# Patient Record
Sex: Male | Born: 1955 | Race: Black or African American | Hispanic: No | Smoking: Current every day smoker
Health system: Southern US, Community
[De-identification: ages and names within clinical notes are randomized; demographics above are authoritative.]

## PROBLEM LIST (undated history)

## (undated) DIAGNOSIS — F32A Depression, unspecified: Secondary | ICD-10-CM

## (undated) DIAGNOSIS — F1410 Cocaine abuse, uncomplicated: Secondary | ICD-10-CM

## (undated) DIAGNOSIS — I10 Essential (primary) hypertension: Secondary | ICD-10-CM

## (undated) DIAGNOSIS — F329 Major depressive disorder, single episode, unspecified: Secondary | ICD-10-CM

## (undated) DIAGNOSIS — J45909 Unspecified asthma, uncomplicated: Secondary | ICD-10-CM

## (undated) DIAGNOSIS — J449 Chronic obstructive pulmonary disease, unspecified: Secondary | ICD-10-CM

---

## 2017-05-10 ENCOUNTER — Encounter (HOSPITAL_COMMUNITY): Payer: Self-pay | Admitting: Emergency Medicine

## 2017-05-10 ENCOUNTER — Emergency Department (HOSPITAL_COMMUNITY): Payer: Medicare Other

## 2017-05-10 ENCOUNTER — Other Ambulatory Visit: Payer: Self-pay

## 2017-05-10 ENCOUNTER — Observation Stay (HOSPITAL_COMMUNITY)
Admission: EM | Admit: 2017-05-10 | Discharge: 2017-05-11 | Disposition: A | Discharge status: Home / Self Care | Payer: Medicare Other | Attending: Internal Medicine | Admitting: Internal Medicine

## 2017-05-10 DIAGNOSIS — J449 Chronic obstructive pulmonary disease, unspecified: Secondary | ICD-10-CM | POA: Insufficient documentation

## 2017-05-10 DIAGNOSIS — R45851 Suicidal ideations: Secondary | ICD-10-CM

## 2017-05-10 DIAGNOSIS — I10 Essential (primary) hypertension: Secondary | ICD-10-CM | POA: Insufficient documentation

## 2017-05-10 DIAGNOSIS — G621 Alcoholic polyneuropathy: Secondary | ICD-10-CM

## 2017-05-10 DIAGNOSIS — F1910 Other psychoactive substance abuse, uncomplicated: Secondary | ICD-10-CM | POA: Diagnosis not present

## 2017-05-10 DIAGNOSIS — F17210 Nicotine dependence, cigarettes, uncomplicated: Secondary | ICD-10-CM | POA: Insufficient documentation

## 2017-05-10 DIAGNOSIS — F1020 Alcohol dependence, uncomplicated: Secondary | ICD-10-CM | POA: Insufficient documentation

## 2017-05-10 DIAGNOSIS — F1420 Cocaine dependence, uncomplicated: Secondary | ICD-10-CM | POA: Insufficient documentation

## 2017-05-10 DIAGNOSIS — R0602 Shortness of breath: Secondary | ICD-10-CM | POA: Diagnosis not present

## 2017-05-10 DIAGNOSIS — F332 Major depressive disorder, recurrent severe without psychotic features: Secondary | ICD-10-CM | POA: Insufficient documentation

## 2017-05-10 DIAGNOSIS — Z91010 Allergy to peanuts: Secondary | ICD-10-CM | POA: Insufficient documentation

## 2017-05-10 DIAGNOSIS — Z79899 Other long term (current) drug therapy: Secondary | ICD-10-CM | POA: Insufficient documentation

## 2017-05-10 DIAGNOSIS — R079 Chest pain, unspecified: Secondary | ICD-10-CM | POA: Diagnosis present

## 2017-05-10 DIAGNOSIS — R0789 Other chest pain: Principal | ICD-10-CM | POA: Insufficient documentation

## 2017-05-10 DIAGNOSIS — F1320 Sedative, hypnotic or anxiolytic dependence, uncomplicated: Secondary | ICD-10-CM | POA: Insufficient documentation

## 2017-05-10 HISTORY — DX: Essential (primary) hypertension: I10

## 2017-05-10 HISTORY — DX: Major depressive disorder, single episode, unspecified: F32.9

## 2017-05-10 HISTORY — DX: Depression, unspecified: F32.A

## 2017-05-10 LAB — COMPREHENSIVE METABOLIC PANEL
ALT: 29 U/L (ref 17–63)
ANION GAP: 9 (ref 5–15)
AST: 43 U/L — ABNORMAL HIGH (ref 15–41)
Albumin: 3.5 g/dL (ref 3.5–5.0)
Alkaline Phosphatase: 89 U/L (ref 38–126)
BUN: 5 mg/dL — ABNORMAL LOW (ref 6–20)
CHLORIDE: 101 mmol/L (ref 101–111)
CO2: 29 mmol/L (ref 22–32)
Calcium: 9.1 mg/dL (ref 8.9–10.3)
Creatinine, Ser: 0.91 mg/dL (ref 0.61–1.24)
GFR calc Af Amer: >60 mL/min (ref >60)
GFR calc non Af Amer: >60 mL/min (ref >60)
Glucose, Bld: 111 mg/dL — ABNORMAL HIGH (ref 65–99)
Potassium: 3.6 mmol/L (ref 3.5–5.1)
SODIUM: 139 mmol/L (ref 135–145)
Total Bilirubin: 0.9 mg/dL (ref 0.3–1.2)
Total Protein: 7.3 g/dL (ref 6.5–8.1)

## 2017-05-10 LAB — CBC
HCT: 33.3 % — ABNORMAL LOW (ref 39.0–52.0)
HEMOGLOBIN: 11.2 g/dL — AB (ref 13.0–17.0)
MCH: 29.9 pg (ref 26.0–34.0)
MCHC: 33.6 g/dL (ref 30.0–36.0)
MCV: 89 fL (ref 78.0–100.0)
Platelets: 267 10*3/uL (ref 150–400)
RBC: 3.74 MIL/uL — AB (ref 4.22–5.81)
RDW: 15.3 % (ref 11.5–15.5)
WBC: 7 10*3/uL (ref 4.0–10.5)

## 2017-05-10 LAB — RAPID URINE DRUG SCREEN, HOSP PERFORMED
Amphetamines: NOT DETECTED
Barbiturates: POSITIVE — AB
Benzodiazepines: NOT DETECTED
COCAINE: POSITIVE — AB
OPIATES: NOT DETECTED
TETRAHYDROCANNABINOL: POSITIVE — AB

## 2017-05-10 LAB — SALICYLATE LEVEL: Salicylate Lvl: <7 mg/dL (ref 2.8–30.0)

## 2017-05-10 LAB — I-STAT TROPONIN, ED
Troponin i, poc: 0.01 ng/mL (ref 0.00–0.08)
Troponin i, poc: 0 ng/mL (ref 0.00–0.08)

## 2017-05-10 LAB — ETHANOL: Alcohol, Ethyl (B): <10 mg/dL (ref <10)

## 2017-05-10 LAB — ACETAMINOPHEN LEVEL

## 2017-05-10 MED ORDER — AMLODIPINE BESYLATE 5 MG PO TABS
5.0000 mg | ORAL_TABLET | Freq: Once | ORAL | Status: AC
  Administered 2017-05-10: 5 mg via ORAL
  Filled 2017-05-10: qty 1

## 2017-05-10 MED ORDER — NAPROXEN 250 MG PO TABS
500.0000 mg | ORAL_TABLET | Freq: Two times a day (BID) | ORAL | Status: AC | PRN
  Administered 2017-05-10 – 2017-05-11 (×2): 500 mg via ORAL
  Filled 2017-05-10 (×2): qty 2

## 2017-05-10 MED ORDER — ENOXAPARIN SODIUM 40 MG/0.4ML ~~LOC~~ SOLN
40.0000 mg | SUBCUTANEOUS | Status: DC
  Administered 2017-05-10: 40 mg via SUBCUTANEOUS
  Filled 2017-05-10: qty 0.4

## 2017-05-10 MED ORDER — ADULT MULTIVITAMIN W/MINERALS CH
1.0000 | ORAL_TABLET | Freq: Every day | ORAL | Status: DC
  Administered 2017-05-10 – 2017-05-11 (×2): 1 via ORAL
  Filled 2017-05-10 (×2): qty 1

## 2017-05-10 MED ORDER — IPRATROPIUM-ALBUTEROL 0.5-2.5 (3) MG/3ML IN SOLN
3.0000 mL | Freq: Once | RESPIRATORY_TRACT | Status: AC
Start: 2017-05-10 — End: 2017-05-10
  Administered 2017-05-10: 3 mL via RESPIRATORY_TRACT
  Filled 2017-05-10: qty 3

## 2017-05-10 MED ORDER — VITAMIN B-1 100 MG PO TABS
100.0000 mg | ORAL_TABLET | Freq: Every day | ORAL | Status: DC
  Administered 2017-05-10 – 2017-05-11 (×2): 100 mg via ORAL
  Filled 2017-05-10 (×2): qty 1

## 2017-05-10 MED ORDER — FOLIC ACID 1 MG PO TABS
1.0000 mg | ORAL_TABLET | Freq: Every day | ORAL | Status: DC
  Administered 2017-05-10 – 2017-05-11 (×2): 1 mg via ORAL
  Filled 2017-05-10 (×2): qty 1

## 2017-05-10 MED ORDER — ATORVASTATIN CALCIUM 20 MG PO TABS
20.0000 mg | ORAL_TABLET | Freq: Every day | ORAL | Status: DC
  Administered 2017-05-10 – 2017-05-11 (×2): 20 mg via ORAL
  Filled 2017-05-10 (×3): qty 1

## 2017-05-10 MED ORDER — ACETAMINOPHEN 650 MG RE SUPP: 650.0000 mg | Freq: Four times a day (QID) | RECTAL | Status: DC | PRN

## 2017-05-10 MED ORDER — CITALOPRAM HYDROBROMIDE 20 MG PO TABS
20.0000 mg | ORAL_TABLET | Freq: Every day | ORAL | Status: DC
  Administered 2017-05-10 – 2017-05-11 (×2): 20 mg via ORAL
  Filled 2017-05-10 (×2): qty 1

## 2017-05-10 MED ORDER — SENNOSIDES-DOCUSATE SODIUM 8.6-50 MG PO TABS: 1.0000 | ORAL_TABLET | Freq: Every evening | ORAL | Status: DC | PRN

## 2017-05-10 MED ORDER — NICOTINE 21 MG/24HR TD PT24
21.0000 mg | MEDICATED_PATCH | Freq: Every day | TRANSDERMAL | Status: DC
  Administered 2017-05-10 – 2017-05-11 (×2): 21 mg via TRANSDERMAL
  Filled 2017-05-10 (×2): qty 1

## 2017-05-10 MED ORDER — THIAMINE HCL 100 MG/ML IJ SOLN: 100.0000 mg | Freq: Every day | INTRAMUSCULAR | Status: DC

## 2017-05-10 MED ORDER — ALBUTEROL SULFATE (2.5 MG/3ML) 0.083% IN NEBU
2.5000 mg | INHALATION_SOLUTION | Freq: Four times a day (QID) | RESPIRATORY_TRACT | Status: DC | PRN
  Filled 2017-05-10: qty 3

## 2017-05-10 MED ORDER — METOPROLOL TARTRATE 25 MG PO TABS
25.0000 mg | ORAL_TABLET | Freq: Two times a day (BID) | ORAL | Status: DC
  Administered 2017-05-10 – 2017-05-11 (×2): 25 mg via ORAL
  Filled 2017-05-10 (×2): qty 1

## 2017-05-10 MED ORDER — ACETAMINOPHEN 325 MG PO TABS
650.0000 mg | ORAL_TABLET | Freq: Four times a day (QID) | ORAL | Status: DC | PRN
  Administered 2017-05-10 – 2017-05-11 (×2): 650 mg via ORAL
  Filled 2017-05-10 (×2): qty 2

## 2017-05-10 MED ORDER — ENSURE ENLIVE PO LIQD: 237.0000 mL | Freq: Two times a day (BID) | ORAL | Status: DC

## 2017-05-10 NOTE — ED Triage Notes (Signed)
EMS stated, onset acute chest pain no other symptoms

## 2017-05-10 NOTE — ED Provider Notes (Signed)
[facility] EMERGENCY DEPARTMENT Provider Note   CSN: [id number] Arrival date & time: 05/10/17  1032     History   Chief Complaint Chief Complaint  Patient presents with  . Chest Pain  . Suicidal  . Addiction Problem    HPI Jonathan Martin is a 62 y.o. male with history of depression, hypertension, COPD, hyperlipidemia presents today for evaluation of acute onset, personally improving substernal chest pain and suicidal ideation.  He states that pain began at around 9 AM this morning when he was getting up from a seated position.  Pain was constant pressure and has "eased up now".  He noted mild shortness of breath associated with this initially which has resolved and which eh attributed to his COPD.  Denies diaphoresis, lightheadedness, syncope, nausea, or vomiting.  He is a current smoker. States he thinks he has had an MI in the past.   He is also complaining of progressively worsening suicidal ideations.  He states that he has a plan to kill himself and bought a knife yesterday.  He states "I want to put it up to my jugular and bleed out ".  He denies homicidal ideation or auditory or visual hallucinations.  He does admit to substance abuse and uses cocaine and marijuana. He tells me "I used as often as I can get my hands on it".   He tells me he is originally from [facility] [facility] and has been "bouncing around all over the place ".  He states he has been staying at hotels.  He tells me that he has lost all of his home medications but is unsure how.  The history is provided by the patient.    Past Medical History:  Diagnosis Date  . Depression   . Hypertension     Patient Active Problem List   Diagnosis Date Noted  . Major depressive disorder, recurrent episode, severe (HCC) 05/11/2017  . Polysubstance abuse (HCC) 05/11/2017  . MDD (major depressive disorder), recurrent episode, severe (HCC) 05/11/2017  . Suicidal ideations   . Alcoholic polyneuropathy  (HCC)   . Chest pain 05/10/2017    History reviewed. No pertinent surgical history.      Home Medications    Prior to Admission medications   Medication Sig Start Date End Date Taking? Authorizing Provider  amLODipine (NORVASC) 10 MG tablet Take 10 mg by mouth daily.   Yes [provider]  atorvastatin (LIPITOR) 20 MG tablet Take 20 mg by mouth daily at 6 PM.   Yes [provider]  citalopram (CELEXA) 40 MG tablet Take 40 mg by mouth daily.   Yes [provider]  ENSURE (ENSURE) Take 237 mLs by mouth 2 (two) times daily between meals.   Yes [provider]  famotidine (PEPCID) 20 MG tablet Take 20 mg by mouth 2 (two) times daily.   Yes [provider]  metoprolol tartrate (LOPRESSOR) 50 MG tablet Take 50 mg by mouth 2 (two) times daily.   Yes [provider]  Multiple Vitamin (MULTIVITAMIN) capsule Take 1 capsule by mouth daily.   Yes [provider]  naproxen (NAPROSYN) 500 MG tablet Take 500 mg by mouth as needed.    Yes [provider]  nicotine (NICODERM CQ - DOSED IN MG/24 HOURS) 21 mg/24hr patch Place 21 mg onto the skin daily.   Yes [provider]  pyridOXINE (VITAMIN B-6) 100 MG tablet Take 100 mg by mouth daily.   Yes [provider]  tamsulosin (FLOMAX) 0.4 MG CAPS capsule Take 0.4 mg by mouth.   Yes [provider]  Albuterol Sulfate 108 (90 Base) MCG/ACT AEPB Inhale 2 puffs into the lungs as needed.    [provider]  hydrocortisone cream 1 % Apply topically 2 (two) times daily as needed for itching. 05/11/17   Levora Dredge, MD  thiamine 100 MG tablet Take 1 tablet (100 mg total) by mouth daily. 05/12/17 06/11/17  Levora Dredge, MD    Family History No family history on file.  Social History Social History   Tobacco Use  . Smoking status: Current Every Day Smoker    Packs/day: 1.00  . Smokeless tobacco: Current User  Substance Use Topics  . Alcohol use: Yes      Alcohol/week: 10.8 oz    Types: 12 Cans of beer, 6 Glasses of wine per week    Comment: drinks daily  . Drug use: Yes    Frequency: 7.0 times per week    Types: Cocaine, Marijuana    Comment: uses daily on binges, whole paycheck     Allergies   Lisinopril and Peanut-containing drug products   Review of Systems Review of Systems  Constitutional: Negative for chills and fever.  Respiratory: Positive for shortness of breath. Negative for cough.   Cardiovascular: Positive for chest pain. Negative for palpitations and leg swelling.  Gastrointestinal: Negative for abdominal pain, nausea and vomiting.  Psychiatric/Behavioral: Positive for suicidal ideas.  All other systems reviewed and are negative.    Physical Exam Updated Vital Signs BP (!) 128/95 (BP Location: Right Arm)   Pulse 76   Temp 98.2 F (36.8 C) (Oral)   Resp 18   Ht  (1.854 m)   Wt 86.7 kg (191 lb 3.2 oz)   SpO2 100%   BMI 25.23 kg/m   Physical Exam  Constitutional: He appears well-developed and well-nourished. No distress.  HENT:  Head: Normocephalic and atraumatic.  Eyes: Conjunctivae are normal. Right eye exhibits no discharge. Left eye exhibits no discharge.  Neck: Normal range of motion. Neck supple. No JVD present. No tracheal deviation present.  Cardiovascular: Normal rate and regular rhythm.  Pulses:      Carotid pulses are 2+ on the right side, and 2+ on the left side.      Radial pulses are 2+ on the right side, and 2+ on the left side.       Dorsalis pedis pulses are 2+ on the right side, and 2+ on the left side.       Posterior tibial pulses are 2+ on the right side, and 2+ on the left side.  Pulmonary/Chest: Effort normal. He has wheezes.  Mild scattered expiratory wheezes.  Speaking in full sentences without difficulty.  SPO2 saturations 100% on room air.  Abdominal: Soft. Bowel sounds are normal. He exhibits no distension. There is no tenderness.  Musculoskeletal: Normal range of  motion. He exhibits no edema.       Right lower leg: Normal. He exhibits no tenderness and no edema.       Left lower leg: Normal. He exhibits no tenderness and no edema.  Neurological: He is alert.  Skin: Skin is warm and dry. No erythema.  Psychiatric: His speech is normal. He expresses suicidal ideation. He expresses no homicidal ideation. He expresses suicidal plans. He expresses no homicidal plans.  Does not appear to be responding to internal stimuli at this time.  Somewhat agitated.  Nursing note and vitals reviewed.  ED Treatments / Results  Labs (all labs ordered are listed, but only abnormal results are displayed) Labs Reviewed  COMPREHENSIVE METABOLIC PANEL - Abnormal; Notable for the following components:      Result Value   Glucose, Bld 111 (*)    BUN 5 (*)    AST 43 (*)    All other components within normal limits  ACETAMINOPHEN LEVEL - Abnormal; Notable for the following components:   Acetaminophen (Tylenol), Serum <10 (*)    All other components within normal limits  CBC - Abnormal; Notable for the following components:   RBC 3.74 (*)    Hemoglobin 11.2 (*)    HCT 33.3 (*)    All other components within normal limits  RAPID URINE DRUG SCREEN, HOSP PERFORMED - Abnormal; Notable for the following components:   Cocaine POSITIVE (*)    Tetrahydrocannabinol POSITIVE (*)    Barbiturates POSITIVE (*)    All other components within normal limits  BASIC METABOLIC PANEL - Abnormal; Notable for the following components:   Potassium 3.3 (*)    Chloride 100 (*)    BUN 5 (*)    Calcium 8.7 (*)    All other components within normal limits  CBC - Abnormal; Notable for the following components:   RBC 3.81 (*)    Hemoglobin 11.0 (*)    HCT 34.1 (*)    All other components within normal limits  ETHANOL  SALICYLATE LEVEL  HIV ANTIBODY (ROUTINE TESTING)  TSH  VITAMIN B12  HEMOGLOBIN A1C  I-STAT TROPONIN, ED  I-STAT TROPONIN, ED    EKG EKG  Interpretation  Date/Time:  Sunday May 10 2017 10:51:14 EDT Ventricular Rate:  76 PR Interval:  154 QRS Duration: 90 QT Interval:  414 QTC Calculation: 465 R Axis:   73 Text Interpretation:  Normal sinus rhythm Nonspecific T wave abnormality Prolonged QT Abnormal ECG no prior to compare with Confirmed by Meridee Score [phone]) on 05/10/2017 2:04:57 PM   Radiology Dg Chest 2 View  Result Date: 05/10/2017 CLINICAL DATA:  Chest pain EXAM: CHEST - 2 VIEW COMPARISON:  None. FINDINGS: Normal heart size. Normal mediastinal contour. No pneumothorax. No pleural effusion. Mildly hyperinflated lungs. No pulmonary edema. No acute consolidative airspace disease. IMPRESSION: Mildly hyperinflated lungs, suggesting COPD. Otherwise no active cardiopulmonary disease. Electronically Signed   By: Delbert Phenix M.D.   On: 05/10/2017 13:55    Procedures Procedures (including critical care time)  Medications Ordered in ED Medications  ipratropium-albuterol (DUONEB) 0.5-2.5 (3) MG/3ML nebulizer solution 3 mL (3 mLs Nebulization Given 05/10/17 1448)  amLODipine (NORVASC) tablet 5 mg (5 mg Oral Given 05/10/17 1835)  naproxen (NAPROSYN) tablet 500 mg (500 mg Oral Given 05/11/17 0804)  potassium chloride SA (K-DUR,KLOR-CON) CR tablet 40 mEq (40 mEq Oral Given 05/11/17 1352)     Initial Impression / Assessment and Plan / ED Course  I have reviewed the triage vital signs and the nursing notes.  Pertinent labs & imaging results that were available during my care of the patient were reviewed by me and considered in my medical decision making (see chart for details).     Patient presents for evaluation of chest pains as well as progressively worsening suicidal ideations.  He is afebrile, vital signs are stable in the ED.  He is nontoxic in appearance.  EKG shows nonspecific T wave abnormalities but we have no comparison to note if this is acute or chronic.  Initial troponin is negative.  Chest x-ray shows changes  consistent  with COPD.  Remainder of lab work shows mild hypokalemia and UDS positive for cocaine, barbiturates, and THC.  He tells me that he uses cocaine as often as he possibly can.  With a HEART score of 5, and given he is homeless with unreliable follow-up, he would benefit from chest pain rule out.  He will require psychiatric evaluation as well.  Spoke with internal medicine teaching service who agreed to assume care of patient and bring him into the hospital for further evaluation and management.  Final Clinical Impressions(s) / ED Diagnoses   Final diagnoses:  Atypical chest pain  SOB (shortness of breath)  Polysubstance abuse (HCC)  Suicidal ideations    ED Discharge Orders        Ordered    thiamine 100 MG tablet  Daily     05/11/17 1428    hydrocortisone cream 1 %  2 times daily PRN     05/11/17 1428       Adeeb Konecny, Putnam A, PA-C 05/12/17 0949    Terrilee Files, MD 05/12/17 [phone]

## 2017-05-10 NOTE — H&P (Signed)
Date: 05/10/2017               Patient Name:  Jonathan Martin MRN: [id number]  DOB: 1955-05-28 Age / Sex: 62 y.o., male   PCP: Patient, No Pcp Per         Medical Service: Internal Medicine Teaching Service         Attending Physician: Dr. Doneen Poisson, MD    First Contact: Dr. Caron Presume Pager: [phone]  Second Contact: Dr. Antony Contras Pager: [phone]       After Hours (After 5p/  First Contact Pager: [phone]  weekends / holidays): Second Contact Pager: [phone]   Chief Complaint: Chest pain   History of Present Illness: Patient is a 62 y.o male with depression, HTN, COPD, substance use disorder (EtOH, cocaine, and marijuana) and HLD presented with acute onset chest pain. He states that the pain began around 9 AM and was a constant pressure. Associated symptoms include SOB. He denies N/V, diaphoresis, lightheadedness, or dizziness with the episode. The patient voices that he has chest pain every time he uses a lot of EtOH, cocaine, and marijuana. He in now currently chest pain free.     He is also endorsing SI, HI, and a suicide plan. He states that since his wife left him 2 years ago he has been spending all his money on drugs and alcohol. During these benders he becomes even more depressed and thinks about hurting himself. Yesterday after a bender he bought a knife and wanted to cut his throat. He is current homeless and bounces from town to town.   The patient has never withdrawal and never goes more than 1 day without drugs or alcohol. He denies seizures or hallucinations (auditory or visual).  Meds:  Current Meds  Medication Sig  . amLODipine (NORVASC) 10 MG tablet Take 10 mg by mouth daily.  Marland Kitchen atorvastatin (LIPITOR) 20 MG tablet Take 20 mg by mouth daily at 6 PM.  . citalopram (CELEXA) 40 MG tablet Take 40 mg by mouth daily.  Marland Kitchen ENSURE (ENSURE) Take 237 mLs by mouth 2 (two) times daily between meals.  . famotidine (PEPCID) 20 MG tablet Take 20 mg by mouth 2 (two) times daily.  .  metoprolol tartrate (LOPRESSOR) 50 MG tablet Take 50 mg by mouth 2 (two) times daily.  . Multiple Vitamin (MULTIVITAMIN) capsule Take 1 capsule by mouth daily.  . naproxen (NAPROSYN) 500 MG tablet Take 500 mg by mouth as needed.   . nicotine (NICODERM CQ - DOSED IN MG/24 HOURS) 21 mg/24hr patch Place 21 mg onto the skin daily.  Marland Kitchen pyridOXINE (VITAMIN B-6) 100 MG tablet Take 100 mg by mouth daily.  . tamsulosin (FLOMAX) 0.4 MG CAPS capsule Take 0.4 mg by mouth.   Allergies: Allergies as of 05/10/2017 - Review Complete 05/10/2017  Allergen Reaction Noted  . Lisinopril Swelling 05/10/2017  . Peanut-containing drug products Swelling 05/10/2017   Past Medical History:  Diagnosis Date  . Depression   . Hypertension    Family History:  Denies family history of CAD, CVA, PAD.   Social History:  Homeless  Uses EtOH, cocaine, and barbiturates   Review of Systems: A complete ROS was negative except as per HPI.  Physical Exam: Blood pressure (!) 148/92, pulse 69, temperature 99.2 F (37.3 C), temperature source Oral, resp. rate 19, height  (1.854 m), weight 192 lb 6.4 oz (87.3 kg), SpO2 100 %.  General: Well nourished male in no acute distress HENT: Normocephalic, atraumatic,  moist mucus membranes Pulm: Good air movement with no wheezing or crackles  CV: RRR, no murmurs, no rubs  Abdomen: Active bowel sounds, soft, non-distended, no tenderness to palpation  Extremities: Pulses palpable in all extremities, no LE edema  Skin: Warm and dry  Neuro: Alert and oriented x 3  EKG: personally reviewed: my interpretation is sinus rhythm with normal axis. Normal intervals. No pathological Q waves appreciated. No ST or T wave abnormalities.   CXR: personally reviewed: my interpretation is good penetration and rotation. No bony or soft tissue abnormalities. No infiltrates or opacities. No cardiomegaly. No increased vascular congestion.   Assessment & Plan by Problem: Active Problems:    Chest pain  Drug-induce chest pain. Patient is a 62 y.o male with depression, HTN, COPD, substance use disorder (EtOH, cocaine, and marijuana) and HLD presented with acute onset chest pain. He endorses recurrent symptoms every time he uses a bunch of EtOH, cocaine, and barbiturates. EKG was nonischemic, and negative troponin x 2. At this point the most likely etiology of the patient's chest pain is substance abuse.  - No further work-up indicated, medically stable   Depression with SI. Patient has a history of depression treated with citalopram 20 mg QD but has been off it for 4-5 days. He feels that it does work well for him and his SI stems mainly from his substance abuse.  - Consult psychiatry in the AM  - Continue citalopram 20 mg QD   Polysubstance abuse. Patient endorses the use of EtOH, cocaine, and barbiturates. Also smokes tobacco.  - Telemetry  - CIWA without ativan  - Nicotine patch   HTN.  - Continue home amlodipine and metoprolol   Asthma.  - Continue home albuterol   Diet: Regular  VTE ppx: Lovenox  CODE STATUS: Full code  Dispo: Admit patient to Inpatient with expected length of stay greater than 2 midnights.  Signed: Levora Dredge, MD 05/10/2017, 6:13 PM  My Pager: [phone]

## 2017-05-10 NOTE — ED Notes (Signed)
Paged security and house coverage for a sitter.
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
## 2017-05-10 NOTE — ED Notes (Signed)
Regular dinner tray ordered 

## 2017-05-10 NOTE — ED Triage Notes (Signed)
22  g left AC

## 2017-05-10 NOTE — ED Triage Notes (Signed)
Pt. Stated, I  Having chest pain and my feet are burning. Im addicted to Cocaine and need some help. I got a knife yesterday to kill myself but did not. Im depressed and I need to stop using drugs.

## 2017-05-10 NOTE — Plan of Care (Signed)
  Problem: Coping: Goal: Level of anxiety will decrease Outcome: Not Progressing   Problem: Health Behavior/Discharge Planning: Goal: Ability to manage health-related needs will improve Outcome: Progressing

## 2017-05-11 ENCOUNTER — Other Ambulatory Visit: Payer: Self-pay

## 2017-05-11 ENCOUNTER — Inpatient Hospital Stay (HOSPITAL_COMMUNITY)
Admission: AD | Admit: 2017-05-11 | Discharge: 2017-05-14 | DRG: 885 | Disposition: A | Discharge status: Home / Self Care | Payer: Medicare Other | Source: Intra-hospital | Attending: Psychiatry | Admitting: Psychiatry

## 2017-05-11 ENCOUNTER — Encounter (HOSPITAL_COMMUNITY): Payer: Self-pay | Admitting: *Deleted

## 2017-05-11 DIAGNOSIS — Z888 Allergy status to other drugs, medicaments and biological substances status: Secondary | ICD-10-CM

## 2017-05-11 DIAGNOSIS — R079 Chest pain, unspecified: Secondary | ICD-10-CM | POA: Diagnosis not present

## 2017-05-11 DIAGNOSIS — R45851 Suicidal ideations: Secondary | ICD-10-CM | POA: Diagnosis present

## 2017-05-11 DIAGNOSIS — G629 Polyneuropathy, unspecified: Secondary | ICD-10-CM

## 2017-05-11 DIAGNOSIS — K219 Gastro-esophageal reflux disease without esophagitis: Secondary | ICD-10-CM | POA: Diagnosis present

## 2017-05-11 DIAGNOSIS — F419 Anxiety disorder, unspecified: Secondary | ICD-10-CM | POA: Diagnosis present

## 2017-05-11 DIAGNOSIS — E785 Hyperlipidemia, unspecified: Secondary | ICD-10-CM | POA: Diagnosis not present

## 2017-05-11 DIAGNOSIS — Z720 Tobacco use: Secondary | ICD-10-CM | POA: Diagnosis not present

## 2017-05-11 DIAGNOSIS — F1099 Alcohol use, unspecified with unspecified alcohol-induced disorder: Secondary | ICD-10-CM | POA: Diagnosis not present

## 2017-05-11 DIAGNOSIS — F1910 Other psychoactive substance abuse, uncomplicated: Secondary | ICD-10-CM | POA: Diagnosis present

## 2017-05-11 DIAGNOSIS — Z91010 Allergy to peanuts: Secondary | ICD-10-CM

## 2017-05-11 DIAGNOSIS — F332 Major depressive disorder, recurrent severe without psychotic features: Secondary | ICD-10-CM | POA: Diagnosis present

## 2017-05-11 DIAGNOSIS — I10 Essential (primary) hypertension: Secondary | ICD-10-CM

## 2017-05-11 DIAGNOSIS — F1210 Cannabis abuse, uncomplicated: Secondary | ICD-10-CM

## 2017-05-11 DIAGNOSIS — F1010 Alcohol abuse, uncomplicated: Secondary | ICD-10-CM | POA: Diagnosis not present

## 2017-05-11 DIAGNOSIS — R0602 Shortness of breath: Secondary | ICD-10-CM | POA: Diagnosis not present

## 2017-05-11 DIAGNOSIS — R0789 Other chest pain: Secondary | ICD-10-CM | POA: Diagnosis not present

## 2017-05-11 DIAGNOSIS — F1410 Cocaine abuse, uncomplicated: Secondary | ICD-10-CM | POA: Diagnosis present

## 2017-05-11 DIAGNOSIS — Z590 Homelessness: Secondary | ICD-10-CM

## 2017-05-11 DIAGNOSIS — G621 Alcoholic polyneuropathy: Secondary | ICD-10-CM | POA: Diagnosis present

## 2017-05-11 DIAGNOSIS — N400 Enlarged prostate without lower urinary tract symptoms: Secondary | ICD-10-CM | POA: Diagnosis present

## 2017-05-11 DIAGNOSIS — F17210 Nicotine dependence, cigarettes, uncomplicated: Secondary | ICD-10-CM

## 2017-05-11 DIAGNOSIS — J45909 Unspecified asthma, uncomplicated: Secondary | ICD-10-CM | POA: Diagnosis present

## 2017-05-11 DIAGNOSIS — J449 Chronic obstructive pulmonary disease, unspecified: Secondary | ICD-10-CM

## 2017-05-11 DIAGNOSIS — F1020 Alcohol dependence, uncomplicated: Secondary | ICD-10-CM | POA: Diagnosis present

## 2017-05-11 DIAGNOSIS — G4700 Insomnia, unspecified: Secondary | ICD-10-CM | POA: Diagnosis present

## 2017-05-11 DIAGNOSIS — R454 Irritability and anger: Secondary | ICD-10-CM

## 2017-05-11 DIAGNOSIS — Z79899 Other long term (current) drug therapy: Secondary | ICD-10-CM

## 2017-05-11 LAB — CBC
HEMATOCRIT: 34.1 % — AB (ref 39.0–52.0)
HEMOGLOBIN: 11 g/dL — AB (ref 13.0–17.0)
MCH: 28.9 pg (ref 26.0–34.0)
MCHC: 32.3 g/dL (ref 30.0–36.0)
MCV: 89.5 fL (ref 78.0–100.0)
Platelets: 244 10*3/uL (ref 150–400)
RBC: 3.81 MIL/uL — AB (ref 4.22–5.81)
RDW: 15.5 % (ref 11.5–15.5)
WBC: 7.7 10*3/uL (ref 4.0–10.5)

## 2017-05-11 LAB — BASIC METABOLIC PANEL
Anion gap: 10 (ref 5–15)
BUN: 5 mg/dL — AB (ref 6–20)
CHLORIDE: 100 mmol/L — AB (ref 101–111)
CO2: 27 mmol/L (ref 22–32)
Calcium: 8.7 mg/dL — ABNORMAL LOW (ref 8.9–10.3)
Creatinine, Ser: 0.95 mg/dL (ref 0.61–1.24)
GFR calc Af Amer: >60 mL/min (ref >60)
GFR calc non Af Amer: >60 mL/min (ref >60)
Glucose, Bld: 88 mg/dL (ref 65–99)
POTASSIUM: 3.3 mmol/L — AB (ref 3.5–5.1)
SODIUM: 137 mmol/L (ref 135–145)

## 2017-05-11 LAB — HIV ANTIBODY (ROUTINE TESTING W REFLEX): HIV Screen 4th Generation wRfx: NONREACTIVE

## 2017-05-11 LAB — HEMOGLOBIN A1C
Hgb A1c MFr Bld: 5.4 % (ref 4.8–5.6)
Mean Plasma Glucose: 108.28 mg/dL

## 2017-05-11 LAB — VITAMIN B12: Vitamin B-12: 655 pg/mL (ref 180–914)

## 2017-05-11 LAB — TSH: TSH: 0.448 u[IU]/mL (ref 0.350–4.500)

## 2017-05-11 MED ORDER — HYDROCORTISONE 1 % EX CREA: TOPICAL_CREAM | Freq: Two times a day (BID) | CUTANEOUS | 0 refills | Status: DC | PRN

## 2017-05-11 MED ORDER — VITAMIN B-6 100 MG PO TABS
100.0000 mg | ORAL_TABLET | Freq: Every day | ORAL | Status: DC
  Administered 2017-05-12 – 2017-05-14 (×3): 100 mg via ORAL
  Filled 2017-05-11 (×5): qty 1

## 2017-05-11 MED ORDER — POTASSIUM CHLORIDE CRYS ER 20 MEQ PO TBCR
40.0000 meq | EXTENDED_RELEASE_TABLET | Freq: Once | ORAL | Status: AC
  Administered 2017-05-11: 40 meq via ORAL
  Filled 2017-05-11: qty 2

## 2017-05-11 MED ORDER — POLYETHYLENE GLYCOL 3350 17 G PO PACK
17.0000 g | PACK | Freq: Every day | ORAL | Status: DC
  Administered 2017-05-11: 17 g via ORAL
  Filled 2017-05-11: qty 1

## 2017-05-11 MED ORDER — HYDROXYZINE HCL 25 MG PO TABS: 25.0000 mg | ORAL_TABLET | Freq: Four times a day (QID) | ORAL | Status: DC | PRN

## 2017-05-11 MED ORDER — MAGNESIUM HYDROXIDE 400 MG/5ML PO SUSP
30.0000 mL | Freq: Every day | ORAL | Status: DC | PRN
  Administered 2017-05-12: 30 mL via ORAL
  Filled 2017-05-11: qty 30

## 2017-05-11 MED ORDER — ALUM & MAG HYDROXIDE-SIMETH 200-200-20 MG/5ML PO SUSP: 30.0000 mL | ORAL | Status: DC | PRN

## 2017-05-11 MED ORDER — TAMSULOSIN HCL 0.4 MG PO CAPS
0.4000 mg | ORAL_CAPSULE | Freq: Every day | ORAL | Status: DC
  Administered 2017-05-11: 0.4 mg via ORAL
  Filled 2017-05-11: qty 1

## 2017-05-11 MED ORDER — THIAMINE HCL 100 MG PO TABS: 100.0000 mg | ORAL_TABLET | Freq: Every day | ORAL | 0 refills | Status: DC

## 2017-05-11 MED ORDER — CITALOPRAM HYDROBROMIDE 40 MG PO TABS
40.0000 mg | ORAL_TABLET | Freq: Every day | ORAL | Status: DC
  Administered 2017-05-12 – 2017-05-14 (×3): 40 mg via ORAL
  Filled 2017-05-11: qty 1
  Filled 2017-05-11: qty 2
  Filled 2017-05-11 (×4): qty 1

## 2017-05-11 MED ORDER — VITAMIN B-1 100 MG PO TABS
100.0000 mg | ORAL_TABLET | Freq: Every day | ORAL | Status: DC
  Administered 2017-05-12 – 2017-05-14 (×3): 100 mg via ORAL
  Filled 2017-05-11 (×6): qty 1

## 2017-05-11 MED ORDER — ATORVASTATIN CALCIUM 20 MG PO TABS
20.0000 mg | ORAL_TABLET | Freq: Every day | ORAL | Status: DC
  Administered 2017-05-12 – 2017-05-13 (×2): 20 mg via ORAL
  Filled 2017-05-11 (×4): qty 1

## 2017-05-11 MED ORDER — NAPROXEN 250 MG PO TABS
500.0000 mg | ORAL_TABLET | Freq: Two times a day (BID) | ORAL | Status: DC
  Administered 2017-05-11: 500 mg via ORAL
  Filled 2017-05-11: qty 2

## 2017-05-11 MED ORDER — NICOTINE 21 MG/24HR TD PT24
21.0000 mg | MEDICATED_PATCH | Freq: Every day | TRANSDERMAL | Status: DC
  Administered 2017-05-12 – 2017-05-14 (×3): 21 mg via TRANSDERMAL
  Filled 2017-05-11 (×6): qty 1

## 2017-05-11 MED ORDER — ALBUTEROL SULFATE HFA 108 (90 BASE) MCG/ACT IN AERS: 2.0000 | INHALATION_SPRAY | RESPIRATORY_TRACT | Status: DC | PRN

## 2017-05-11 MED ORDER — NAPROXEN 500 MG PO TABS
500.0000 mg | ORAL_TABLET | Freq: Two times a day (BID) | ORAL | Status: DC
  Filled 2017-05-11 (×2): qty 1

## 2017-05-11 MED ORDER — METOPROLOL TARTRATE 50 MG PO TABS
50.0000 mg | ORAL_TABLET | Freq: Two times a day (BID) | ORAL | Status: DC
  Administered 2017-05-11 – 2017-05-14 (×6): 50 mg via ORAL
  Filled 2017-05-11 (×6): qty 1
  Filled 2017-05-11: qty 2
  Filled 2017-05-11 (×2): qty 1
  Filled 2017-05-11: qty 2
  Filled 2017-05-11 (×2): qty 1

## 2017-05-11 MED ORDER — ACETAMINOPHEN 325 MG PO TABS
650.0000 mg | ORAL_TABLET | Freq: Four times a day (QID) | ORAL | Status: DC | PRN
  Administered 2017-05-11: 650 mg via ORAL
  Filled 2017-05-11 (×2): qty 2

## 2017-05-11 MED ORDER — TRAZODONE HCL 50 MG PO TABS
50.0000 mg | ORAL_TABLET | Freq: Every evening | ORAL | Status: DC | PRN
Start: 2017-05-11 — End: 2017-05-14
  Filled 2017-05-11 (×10): qty 1

## 2017-05-11 MED ORDER — ENSURE ENLIVE PO LIQD
237.0000 mL | Freq: Two times a day (BID) | ORAL | Status: DC
  Administered 2017-05-12 – 2017-05-14 (×5): 237 mL via ORAL

## 2017-05-11 MED ORDER — NAPHAZOLINE-GLYCERIN 0.012-0.2 % OP SOLN
1.0000 [drp] | Freq: Four times a day (QID) | OPHTHALMIC | Status: DC | PRN
  Filled 2017-05-11: qty 15

## 2017-05-11 MED ORDER — AMLODIPINE BESYLATE 5 MG PO TABS
5.0000 mg | ORAL_TABLET | Freq: Every day | ORAL | Status: DC
  Administered 2017-05-11: 5 mg via ORAL
  Filled 2017-05-11: qty 1

## 2017-05-11 MED ORDER — AMLODIPINE BESYLATE 10 MG PO TABS
10.0000 mg | ORAL_TABLET | Freq: Every day | ORAL | Status: DC
  Administered 2017-05-12 – 2017-05-14 (×3): 10 mg via ORAL
  Filled 2017-05-11 (×5): qty 1
  Filled 2017-05-11: qty 2

## 2017-05-11 MED ORDER — HYDROCORTISONE 1 % EX CREA
TOPICAL_CREAM | Freq: Two times a day (BID) | CUTANEOUS | Status: DC | PRN
  Filled 2017-05-11: qty 28

## 2017-05-11 MED ORDER — NAPROXEN 500 MG PO TABS
500.0000 mg | ORAL_TABLET | Freq: Two times a day (BID) | ORAL | Status: DC
  Administered 2017-05-12 – 2017-05-14 (×5): 500 mg via ORAL
  Filled 2017-05-11 (×11): qty 1

## 2017-05-11 MED ORDER — TAMSULOSIN HCL 0.4 MG PO CAPS
0.4000 mg | ORAL_CAPSULE | Freq: Every day | ORAL | Status: DC
  Administered 2017-05-12 – 2017-05-13 (×2): 0.4 mg via ORAL
  Filled 2017-05-11 (×4): qty 1

## 2017-05-11 NOTE — Clinical Social Work Note (Signed)
Clinical Social Work Assessment  Patient Details  Name: Jonathan Martin MRN: [id number] Date of Birth: [birth year]  Date of referral:  05/11/17               Reason for consult:  Facility Placement, Suicide Risk/Attempt, Mental Health Concerns, Substance Use/ETOH Abuse                Permission sought to share information with:  Facility Art therapist granted to share information::     Name::        Agency::  [facility]  Relationship::     Contact Information:     Housing/Transportation Living arrangements for the past 2 months:  No permanent address Source of Information:  Patient Patient Interpreter Needed:  None Criminal Activity/Legal Involvement Pertinent to Current Situation/Hospitalization:  No - Comment as needed Significant Relationships:    Lives with:  Self Do you feel safe going back to the place where you live?  No Need for family participation in patient care:  No (Coment)  Care giving concerns: Psychiatry recommending inpatient psychiatric placement due to suicidal thoughts.   Social Worker assessment / plan: CSW met with patient at bedside. Patient alert and oriented. Safety sitter present. Patient presented CSW with list of things that he needs, including reading glasses, clothing, "assisted living" and mental health treatment, as well as "long term substance use treatment."   CSW introduced self and role and reason for consult. CSW discussed with patient psychiatry's recommendation for inpatient psychiatric treatment. Patient aware of the recommendation. Patient agreeable to admit to [facility] voluntarily. Patient signed Voluntary Consent form and CSW witnessed the form.  Patient adamant about addressing other needs on his list. CSW indicated CSW can provide list of outpatient and inpatient substance use resources, though patient's substance use can also be addressed at [facility]. CSW can also see if clothing in [place] office closet is in patient's size. Patient aware any  belongings will be locked up and sent with him over to [facility], and he will not be able to access them at this time, including any clothing CSW provides.   CSW faxed Voluntary Consent form to [facility] and spoke to [place], [place]. [place] will accept patient this evening. CSW to support with discharge.  Employment status:  Disabled (Comment on whether or not currently receiving Disability) Insurance information:  Medicare PT Recommendations:  Not assessed at this time Information / Referral to community resources:  Inpatient Psychiatric Care (Comment Required), Residential Substance Abuse Treatment Options, Outpatient Substance Abuse Treatment Options([facility] placement)  Patient/Family's Response to care: Patient appreciative of care.  Patient/Family's Understanding of and Emotional Response to Diagnosis, Current Treatment, and Prognosis: Patient with understanding of need for inpatient psychiatric treatment and willing to voluntarily admit.  Emotional Assessment Appearance:  Appears stated age Attitude/Demeanor/Rapport:  Engaged Affect (typically observed):  Calm, Appropriate Orientation:  Oriented to Self, Oriented to Place, Oriented to  Time, Oriented to Situation Alcohol / Substance use:  Illicit Drugs Psych involvement (Current and /or in the community):  Yes (Comment), No (Comment)  Discharge Needs  Concerns to be addressed:  Discharge Planning Concerns, Substance Abuse Concerns, Mental Health Concerns, Homelessness Readmission within the last 30 days:  No Current discharge risk:  Substance Abuse, Other(suicidal ideation) Barriers to Discharge:  No Barriers Identified   Estanislado Emms, LCSW 05/11/2017, 1:51 PM

## 2017-05-11 NOTE — H&P (Signed)
Internal Medicine Attending Admission Note Date: 05/11/2017  Patient name: Avyon Herendeen Medical record number: [id number] Date of birth: 03/06/1955 Age: 62 y.o. Gender: male  I saw and evaluated the patient. I reviewed the resident's note and I agree with the resident's findings and plan as documented in the resident's note.  Chief Complaint(s): Chest pain suicidal ideation, homicidal ideation.  History - key components related to admission:  Mr. Valeriano is a 62 year old man with a history of substance use disorder including alcohol, cocaine, and marijuana abuse, depression, hypertension, and hyperlipidemia who presents with chest pain. He was in his usual state of health until 9:00 on the morning of admission when he developed the onset of chest pressure that was described as constant and associated with shortness of breath. He denied nausea, diaphoresis, or dizziness. He admits to frequent cocaine abuse with associated chest pain after each use.  He also notes he has had suicidal and homicidal ideations.He has a plan that consists of cutting his throat with a knife he recently bought. He was initially seen by psychiatry telehealth and was felt to meet inpatient criteria once medically cleared.  Physical Exam - key components related to admission:  Vitals:   05/10/17 1803 05/10/17 2159 05/11/17 0404 05/11/17 0407  BP: (!) 148/92 129/78  (!) 140/93  Pulse: 69 68  (!) 58  Resp: 19 18    Temp: 99.2 F (37.3 C) 99.1 F (37.3 C)  98.6 F (37 C)  TempSrc: Oral Oral  Oral  SpO2: 100% 96%  99%  Weight: 192 lb 6.4 oz (87.3 kg)  191 lb 3.2 oz (86.7 kg)   Height:  (1.854 m)      Gen.: Well-developed, well-nourished, man lying comfortably in bed in no acute distress.  Lab results:  Basic Metabolic Panel: Recent Labs    05/10/17 1055 05/11/17 0347  NA 139 137  K 3.6 3.3*  CL 101 100*  CO2 29 27  GLUCOSE 111* 88  BUN 5* 5*  CREATININE 0.91 0.95  CALCIUM 9.1 8.7*   Liver Function  Tests: Recent Labs    05/10/17 1055  AST 43*  ALT 29  ALKPHOS 89  BILITOT 0.9  PROT 7.3  ALBUMIN 3.5   CBC: Recent Labs    05/10/17 1055 05/11/17 0347  WBC 7.0 7.7  HGB 11.2* 11.0*  HCT 33.3* 34.1*  MCV 89.0 89.5  PLT 267 244   Hemoglobin A1C: Recent Labs    05/11/17 1153  HGBA1C 5.4   Thyroid Function Tests: Recent Labs    05/11/17 1153  TSH 0.448   Anemia Panel: Recent Labs    05/11/17 1153  VITAMINB12 655   Urine Drug Screen:  Positive for cocaine, THC, and barbiturates Negative for opiates, benzodiazepines, and amphetamines  Alcohol Level: Recent Labs    05/10/17 1055  ETH <10   Misc. Labs:  Salicylate level undetectable Acetaminophen level undetectable HIV nonreactive  Imaging results:   PA and lateral chest x-ray: Personally reviewed. No effusions, infiltrates, or masses. No comparisons available.  Other results:  EKG (05/10/2017): Personally reviewed. Normal sinus rhythm at 76 bpm, normal axis, normal intervals, no significant Q waves, no LVH by voltage, good R wave progression, no ST or T wave changes. There are no comparisons available.  EKG (05/11/2017): Personally reviewed. Normal sinus bradycardia at 57 bpm, normal axis, normal intervals, no significant Q waves, no LVH by voltage, good R wave progression, no ST or T wave changes. It is unchanged from the previous  ECG on 05/10/2017.  Assessment & Plan by Problem:  Mr. Poitras is a 62 year old man with a history of substance use disorder including alcohol, cocaine, and marijuana abuse, depression, hypertension, and hyperlipidemia who presents with chest pain. He has ruled out for myocardial infarction with serial enzymes and ECGs. He continues to have suicidal and homicidal ideation and is a candidate for inpatient behavioral health. 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  Plan  1) Chest pain: Likely related to cocaine abuse.  He was advised to work on cocaine abstinence.  2) Suicidal and homicidal ideations: He has been accepted by behavioral health for inpatient care. I suspect detox will be part of that care.  3) Disposition:He will be transferred to behavioral health today.

## 2017-05-11 NOTE — Evaluation (Signed)
Physical Therapy Evaluation and Discharge Patient Details Name: Jonathan Martin MRN: [id number] DOB: September 20, 1955 Today's Date: 05/11/2017   History of Present Illness  62 yo male with onset of chest pain after drug ingestion and depression was admitted.  Has polysubstance abuse, new PN and elevated BP.  He is transitioning to inpt psych care but stated he is homeless.  PMHx:  HTN, COPD, CVA, CAD, PAD, PN, substance abuse  Clinical Impression  Pt is demonstrating pain with feet during mobility but declined to have PT involved with solving the problem with an AD.  Pt is able to walk without PT assistance and reluctant to let PT assist, so will step back from this and can allow him to walk independently given the functional demonstration.  Will DC physical therapy for now.    Follow Up Recommendations No PT follow up    Equipment Recommendations  None recommended by PT    Recommendations for Other Services       Precautions / Restrictions Precautions Precautions: Fall Restrictions Weight Bearing Restrictions: No      Mobility  Bed Mobility Overal bed mobility: Modified Independent             General bed mobility comments: extra time due to foot pain  Transfers Overall transfer level: Modified independent Equipment used: None             General transfer comment: pt was able to transition without PT but is slower due to pain  Ambulation/Gait Ambulation/Gait assistance: Supervision;Min guard(for safety due to neuropathy) Ambulation Distance (Feet): 150 Feet Assistive device: 1 person hand held assist Gait Pattern/deviations: Step-through pattern;Decreased stride length;Wide base of support;Drifts right/left Gait velocity: reduced Gait velocity interpretation: <1.31 ft/sec, indicative of household ambulator General Gait Details: no LOB with any gait and pt was impatient for PT to leave  Stairs Stairs: (pt declines this)          Wheelchair Mobility    Modified  Rankin (Stroke Patients Only)       Balance Overall balance assessment: No apparent balance deficits (not formally assessed)                                           Pertinent Vitals/Pain Pain Assessment: Faces Faces Pain Scale: Hurts even more Pain Location: feet with standing Pain Descriptors / Indicators: Burning Pain Intervention(s): Limited activity within patient's tolerance;Monitored during session;Repositioned    Home Living Family/patient expects to be discharged to:: Other (Comment) Living Arrangements: Other (Comment)(homeless)               Additional Comments: pt is scheduled for inpt care for his depression    Prior Function Level of Independence: Independent               Hand Dominance        Extremity/Trunk Assessment   Upper Extremity Assessment Upper Extremity Assessment: Overall WFL for tasks assessed    Lower Extremity Assessment Lower Extremity Assessment: Overall WFL for tasks assessed    Cervical / Trunk Assessment Cervical / Trunk Assessment: Normal  Communication   Communication: No difficulties  Cognition Arousal/Alertness: Awake/alert Behavior During Therapy: Impulsive;Restless Overall Cognitive Status: Within Functional Limits for tasks assessed  General Comments: pt was impatient for PT to finish and did not understand the purpose of doing a mobility evaluation      General Comments      Exercises     Assessment/Plan    PT Assessment Patent does not need any further PT services  PT Problem List         PT Treatment Interventions      PT Goals (Current goals can be found in the Care Plan section)  Acute Rehab PT Goals Patient Stated Goal: to leave soon PT Goal Formulation: All assessment and education complete, DC therapy    Frequency     Barriers to discharge        Co-evaluation               AM-PAC PT "6 Clicks" Daily  Activity  Outcome Measure Difficulty turning over in bed (including adjusting bedclothes, sheets and blankets)?: None Difficulty moving from lying on back to sitting on the side of the bed? : None Difficulty sitting down on and standing up from a chair with arms (e.g., wheelchair, bedside commode, etc,.)?: None Help needed moving to and from a bed to chair (including a wheelchair)?: None Help needed walking in hospital room?: A Little Help needed climbing 3-5 steps with a railing? : A Little 6 Click Score: 22    End of Session Equipment Utilized During Treatment: (pt jumped up to walk without permitting PT to use the belt) Activity Tolerance: Patient tolerated treatment well Patient left: in bed;with call bell/phone within reach;with nursing/sitter in room Nurse Communication: Mobility status PT Visit Diagnosis: Other abnormalities of gait and mobility (R26.89)    Time: 1610-9604 PT Time Calculation (min) (ACUTE ONLY): 8 min   Charges:   PT Evaluation $PT Eval Low Complexity: 1 Low     PT G Codes:   PT G-Codes **NOT FOR INPATIENT CLASS** Functional Assessment Tool Used: AM-PAC 6 Clicks Basic Mobility    Ivar Drape 05/11/2017, 3:00 PM   Samul Dada, PT MS Acute Rehab Dept. Number: [facility] [id number] and [facility] [phone]

## 2017-05-11 NOTE — Progress Notes (Signed)
Patient will discharge to [facility]. Accepting provider Celso Amy, attending physician Dr. Jama Flavors.   Anticipated discharge date: 05/11/17  Transportation by: [employer]. Transportation will be scheduled for 7:45 pm, as [facility] will be ready to accept patient at 8pm.   Nurse to call report to [phone]. Nurse should call report before patient is transported. Patient will go to room 306 bed 1 at [facility].  CSW signing off.  Abigail Butts, LCSWA  Clinical Social Worker

## 2017-05-11 NOTE — Progress Notes (Signed)
Jonathan Martin is a 62 year old male pt admitted on voluntary basis. On admission, he spoke about how he has been drinking and going from town to town and living in hotels. He does endorse depression and does report recent suicidal feelings but denies any presently on admission and is able to contract for safety while in the hospital. He reports that he is prescribed medications by the [place] but reports he is not taking them like he should. He reports that he wants help with his drinking problem and reports that he is homeless and wants to get into a long term treatment facility when he gets discharged. Jonathan Martin was oriented to the unit and safety maintained.

## 2017-05-11 NOTE — Progress Notes (Signed)
Internal Medicine Attending  Date: 05/11/2017  Patient name: Jonathan Martin Medical record number: [id number] Date of birth: [birth year] Age: 62 y.o. Gender: male  I saw and evaluated the patient. I reviewed the resident's note by Dr. Caron Presume and I agree with the resident's findings and plans as documented in her progress note.  Please see my H&P dated 05/11/2017 for the specifics of my evaluation, assessment, and plan from earlier in the day.

## 2017-05-11 NOTE — Progress Notes (Signed)
   Subjective: Patient feeling well today but has multiple requests for his medical management. Continues to want inpatient psych treatment. He is concerned about his numbness and tingling in his feet that started 2 days ago. He is concerned for DM as it runs in the family. Discussed further work-up but this can be pursued as an outpatient. All questions and concerns addressed.   Objective: Vital signs in last 24 hours: Vitals:   05/10/17 1803 05/10/17 2159 05/11/17 0404 05/11/17 0407  BP: (!) 148/92 129/78  (!) 140/93  Pulse: 69 68  (!) 58  Resp: 19 18    Temp: 99.2 F (37.3 C) 99.1 F (37.3 C)  98.6 F (37 C)  TempSrc: Oral Oral  Oral  SpO2: 100% 96%  99%  Weight: 192 lb 6.4 oz (87.3 kg)  191 lb 3.2 oz (86.7 kg)   Height:  (1.854 m)      General: Well nourished male in no acute distress Pulm: Good air movement with no wheezing or crackles  CV: RRR, no murmurs, no rubs  Extremities: Pulses palpable in all extremities, no LE edema   Assessment/Plan:  Patient clear from a medical standpoint. Will await psychiatry evaluation for whether the patient needs inpatient pysch placement.   Drug-induce chest pain. Patient is a 62 y.o male with depression, HTN, COPD, substance use disorder (EtOH, cocaine, and marijuana) and HLD presented with acute onset chest pain. He endorses recurrent symptoms every time he uses a bunch of EtOH, cocaine, and barbiturates. EKG was nonischemic, and negative troponin x 2. At this point the most likely etiology of the patient's chest pain is substance abuse.  - No further work-up indicated, medically stable   Depression with SI. Patient has a history of depression treated with citalopram 20 mg QD but has been off it for 4-5 days. He feels that it does work well for him and his SI stems mainly from his substance abuse.  - Consulted psychiatry - Continue citalopram 20 mg QD   Polysubstance abuse. Patient endorses the use of EtOH, cocaine, and  barbiturates. Also smokes tobacco.  - CIWA without ativan  - Nicotine patch   Neuropathy. Patient complaining of burning and numbness in his bilateral feet. This could be secondary to extensive EtOH use. However, we will check a TSH, A1c, and B12 level   HTN.  - Continue home amlodipine and metoprolol   Asthma.  - Continue home albuterol   Dispo: Pending psych evaluation. Medical cleared.   Levora Dredge, MD 05/11/2017, 11:37 AM My Pager: [phone]

## 2017-05-11 NOTE — Tx Team (Signed)
Initial Treatment Plan 05/11/2017 8:50 PM Jonathan Martin WGN:[id number]    PATIENT STRESSORS: Medication change or noncompliance Substance abuse   PATIENT STRENGTHS: Ability for insight Average or above average intelligence Capable of independent living General fund of knowledge Motivation for treatment/growth   PATIENT IDENTIFIED PROBLEMS: Depression Substance Abuse Suicidal thoughts "I'm homeless, need a Child psychotherapist and long-term treatment"                     DISCHARGE CRITERIA:  Ability to meet basic life and health needs Improved stabilization in mood, thinking, and/or behavior Verbal commitment to aftercare and medication compliance  PRELIMINARY DISCHARGE PLAN: Attend aftercare/continuing care group  PATIENT/FAMILY INVOLVEMENT: This treatment plan has been presented to and reviewed with the patient, Jonathan Martin, and/or family member, .  The patient and family have been given the opportunity to ask questions and make suggestions.  Aracelli Woloszyn, [place], [place] 05/11/2017, 8:50 PM

## 2017-05-11 NOTE — Discharge Summary (Signed)
Name: Jonathan Martin MRN: [id number] DOB: 07/12/1955 62 y.o. PCP: Patient, No Pcp Per  Date of Admission: 05/10/2017 10:32 AM Date of Discharge:  Attending Physician: Doneen Poisson, MD  Discharge Diagnosis: 1. Major Depressive Disorder with SI 2. Drug-induced chest pain.  3. Lower extremity neuropathy  Principal Problem:   Major depressive disorder, recurrent episode, severe (HCC) Active Problems:   Chest pain   Polysubstance abuse [facility])  Discharge Medications: Allergies as of 05/11/2017      Reactions   Lisinopril Swelling   Peanut-containing Drug Products Swelling      Medication List    TAKE these medications   Albuterol Sulfate 108 (90 Base) MCG/ACT Aepb Inhale 2 puffs into the lungs as needed.   amLODipine 10 MG tablet Commonly known as:  NORVASC Take 10 mg by mouth daily.   atorvastatin 20 MG tablet Commonly known as:  LIPITOR Take 20 mg by mouth daily at 6 PM.   citalopram 40 MG tablet Commonly known as:  CELEXA Take 40 mg by mouth daily.   ENSURE Take 237 mLs by mouth 2 (two) times daily between meals.   famotidine 20 MG tablet Commonly known as:  PEPCID Take 20 mg by mouth 2 (two) times daily.   hydrocortisone cream 1 % Apply topically 2 (two) times daily as needed for itching.   metoprolol tartrate 50 MG tablet Commonly known as:  LOPRESSOR Take 50 mg by mouth 2 (two) times daily.   multivitamin capsule Take 1 capsule by mouth daily.   naproxen 500 MG tablet Commonly known as:  NAPROSYN Take 500 mg by mouth as needed.   nicotine 21 mg/24hr patch Commonly known as:  NICODERM CQ - dosed in mg/24 hours Place 21 mg onto the skin daily.   pyridOXINE 100 MG tablet Commonly known as:  VITAMIN B-6 Take 100 mg by mouth daily.   tamsulosin 0.4 MG Caps capsule Commonly known as:  FLOMAX Take 0.4 mg by mouth.   thiamine 100 MG tablet Take 1 tablet (100 mg total) by mouth daily. Start taking on:  05/12/2017       Disposition and follow-up:    Mr.Jonathan Martin was discharged from [facility] in Stable condition.  At the hospital follow up visit please address:  1.  Substance abuse. Continue thiamine and multivitamin replacement as this may be contributing to his neuropathy symptoms. No other changes were made to his medical regimen.   2.  Labs / imaging needed at time of follow-up: None  3.  Pending labs/ test needing follow-up: B12, HIV  Follow-up Appointments:   Hospital Course by problem list: Principal Problem:   Major depressive disorder, recurrent episode, severe (HCC) Active Problems:   Chest pain   Polysubstance abuse (HCC)   1. Major Depressive Disorder with SI. Patient has a history of depression treated with citalopram 20 mg QD but has been off it for 4-5 days. He endorses depressed mood and recurrent SI for approximately 2 years, and plans to cut his throat. Inpatient psych was consult for further evaluation. It was recommended that he go to inpatient psych treatment.   2. Drug-induced chest pain. Patient is a 62 y.o male with depression, HTN, COPD,substance use disorder (EtOH, cocaine, and marijuana)and HLD presented with acute onset chest pain.He endorses recurrent symptoms every time he uses a bunch of EtOH, cocaine, and barbiturates. EKG was nonischemic, and negative troponin x 2. At this point the most likely etiology of the patient's chest pain is substance  abuse. He was discharged in stable condition.   3. Lower extremity neuropathy. Patient presented with new onset burning in his bilateral feet. 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 His A1c was found to be 5.8% and his TSH was within normal limits.   Discharge Vitals:   BP (!) 140/93 (BP Location: Right Arm)   Pulse (!) 58   Temp 98.6 F (37 C) (Oral)   Resp 18   Ht  (1.854 m)   Wt 191 lb 3.2 oz (86.7 kg)   SpO2 99%   BMI 25.23 kg/m   Discharge  Instructions:   Signed: Levora Dredge, MD 05/11/2017, 2:28 PM   My Pager: [phone]

## 2017-05-11 NOTE — Plan of Care (Signed)
  Problem: Clinical Measurements: Goal: Cardiovascular complication will be avoided Outcome: Progressing   Problem: Pain Managment: Goal: General experience of comfort will improve Outcome: Progressing   

## 2017-05-11 NOTE — Consult Note (Signed)
[facility] Face-to-Face Psychiatry Consult   Reason for Consult:  Suicidal ideations Referring Physician:  Consulting MD Patient Identification: Jonathan Martin MRN:  [id number] Principal Diagnosis: Major depressive disorder, recurrent episode, severe ([place]) Diagnosis:   Patient Active Problem List   Diagnosis Date Noted  . Major depressive disorder, recurrent episode, severe ([place]) [F33.2] 05/11/2017    Priority: High  . Cocaine abuse ([place]) [F14.10] 05/11/2017    Priority: High  . Chest pain [R07.9] 05/10/2017    Total Time spent with patient: 45 minutes  Subjective:   Jonathan Martin is a 62 y.o. male patient admitted to [facility] with suicide plan to overdose, increase his citalopram from 20 mg daily to 40 mg per patient's request.  HPI:  62 yo male who came to the hospital for chest pain after using cocaine.  Today, he continues to endorse suicidal ideations with a plan to overdose.  He reports daily use of alcohol, cocaine, and cannabis.  Last drink prior to coming to ED but drug panel negative for alcohol.  He wants his citalopram increased to 40 mg as the 20 mg dose just makes "me mad."  Some irritability on assessment.  Denies hallucinations, homicidal ideations, and withdrawal symptoms.  He reports he recently moved from [place] and is homeless.  He has been to rehab in the past but "it has been awhile." His wife left him two years ago and his life has spiraled downward since this time.  Past Psychiatric History: substance abuse, depression.  Risk to Self: Suicidal Ideation: Yes-Currently Present Suicidal Intent: Yes-Currently Present Is patient at risk for suicide?: Yes Suicidal Plan?: Yes-Currently Present(cut juglar vein, bleed out) Specify Current Suicidal Plan: (cut neck) Access to Means: Yes Specify Access to Suicidal Means: (states that he bought a brand new knife) What has been your use of drugs/alcohol within the last 12 months?: (using or drinking daily) How many times?: (states that  he fell from a roof 5 yrs ago, broke bones) Other Self Harm Risks: (homeless, minimal support and a drug problem) Triggers for Past Attempts: None known Intentional Self Injurious Behavior: None Risk to Others: Homicidal Ideation: No Thoughts of Harm to Others: No Current Homicidal Intent: No Current Homicidal Plan: No Access to Homicidal Means: No Identified Victim: none History of harm to others?: No Assessment of Violence: None Noted Violent Behavior Description: none Does patient have access to weapons?: Yes (Comment)(has a brand new knifw) Criminal Charges Pending?: No Does patient have a court date: No Prior Inpatient Therapy: Prior Inpatient Therapy: Yes Prior Therapy Dates: (1.5 years ago) Prior Therapy Facilty/Provider(s): [facility]) Reason for Treatment: (depression and SA use) Prior Outpatient Therapy: Prior Outpatient Therapy: No Does patient have an ACCT team?: No Does patient have Intensive In-House Services?  : No Does patient have [facility] services? : No Does patient have P4CC services?: No  Past Medical History:  Past Medical History:  Diagnosis Date  . Depression   . Hypertension    History reviewed. No pertinent surgical history. Family History: No family history on file. Family Psychiatric  History: none Social History:  Social History   Substance and Sexual Activity  Alcohol Use Yes  . Alcohol/week: 10.8 oz  . Types: 12 Cans of beer, 6 Glasses of wine per week   Comment: drinks daily     Social History   Substance and Sexual Activity  Drug Use Yes  . Frequency: 7.0 times per week  . Types: Cocaine, Marijuana   Comment: uses daily on binges, whole paycheck  Social History   Socioeconomic History  . Marital status: Divorced    Spouse name: Not on file  . Number of children: Not on file  . Years of education: Not on file  . Highest education level: Not on file  Occupational History  . Not on file  Social Needs  . Financial resource  strain: Not on file  . Food insecurity:    Worry: Not on file    Inability: Not on file  . Transportation needs:    Medical: Not on file    Non-medical: Not on file  Tobacco Use  . Smoking status: Current Every Day Smoker    Packs/day: 1.00  . Smokeless tobacco: Current User  Substance and Sexual Activity  . Alcohol use: Yes    Alcohol/week: 10.8 oz    Types: 12 Cans of beer, 6 Glasses of wine per week    Comment: drinks daily  . Drug use: Yes    Frequency: 7.0 times per week    Types: Cocaine, Marijuana    Comment: uses daily on binges, whole paycheck  . Sexual activity: Yes  Lifestyle  . Physical activity:    Days per week: Not on file    Minutes per session: Not on file  . Stress: Not on file  Relationships  . Social connections:    Talks on phone: Not on file    Gets together: Not on file    Attends religious service: Not on file    Active member of club or organization: Not on file    Attends meetings of clubs or organizations: Not on file    Relationship status: Not on file  Other Topics Concern  . Not on file  Social History Narrative  . Not on file   Additional Social History:    Allergies:   Allergies  Allergen Reactions  . Lisinopril Swelling  . Peanut-Containing Drug Products Swelling    Labs:  Results for orders placed or performed during the hospital encounter of 05/10/17 (from the past 48 hour(s))  Comprehensive metabolic panel     Status: Abnormal   Collection Time: 05/10/17 10:55 AM  Result Value Ref Range   Sodium 139 135 - 145 mmol/L   Potassium 3.6 3.5 - 5.1 mmol/L   Chloride 101 101 - 111 mmol/L   CO2 29 22 - 32 mmol/L   Glucose, Bld 111 (H) 65 - 99 mg/dL   BUN 5 (L) 6 - 20 mg/dL   Creatinine, Ser 0.91 0.61 - 1.24 mg/dL   Calcium 9.1 8.9 - 10.3 mg/dL   Total Protein 7.3 6.5 - 8.1 g/dL   Albumin 3.5 3.5 - 5.0 g/dL   AST 43 (H) 15 - 41 U/L   ALT 29 17 - 63 U/L   Alkaline Phosphatase 89 38 - 126 U/L   Total Bilirubin 0.9 0.3 - 1.2  mg/dL   GFR calc non Af Amer >60 >60 mL/min   GFR calc Af Amer >60 >60 mL/min    Comment: (NOTE) The eGFR has been calculated using the CKD EPI equation. This calculation has not been validated in all clinical situations. eGFR's persistently <60 mL/min signify possible Chronic Kidney Disease.    Anion gap 9 5 - 15    Comment: Performed at [facility] [address], [place], [place] [address]  Ethanol     Status: None   Collection Time: 05/10/17 10:55 AM  Result Value Ref Range   Alcohol, Ethyl (B) <10 <10 mg/dL  Comment:        LOWEST DETECTABLE LIMIT FOR SERUM ALCOHOL IS 10 mg/dL FOR MEDICAL PURPOSES ONLY Performed at [facility], [place] [address], [place], [place] [address]   Salicylate level     Status: None   Collection Time: 05/10/17 10:55 AM  Result Value Ref Range   Salicylate Lvl <1.2 2.8 - 30.0 mg/dL    Comment: Performed at [facility] [address] [place], [place] [address]  Acetaminophen level     Status: Abnormal   Collection Time: 05/10/17 10:55 AM  Result Value Ref Range   Acetaminophen (Tylenol), Serum <10 (L) 10 - 30 ug/mL    Comment:        THERAPEUTIC CONCENTRATIONS VARY SIGNIFICANTLY. A RANGE OF 10-30 ug/mL MAY BE AN EFFECTIVE CONCENTRATION FOR MANY PATIENTS. HOWEVER, SOME ARE BEST TREATED AT CONCENTRATIONS OUTSIDE THIS RANGE. ACETAMINOPHEN CONCENTRATIONS >150 ug/mL AT 4 HOURS AFTER INGESTION AND >50 ug/mL AT 12 HOURS AFTER INGESTION ARE OFTEN ASSOCIATED WITH TOXIC REACTIONS. Performed at [facility], [place] [address], [place], [place] [address]   cbc     Status: Abnormal   Collection Time: 05/10/17 10:55 AM  Result Value Ref Range   WBC 7.0 4.0 - 10.5 K/uL   RBC 3.74 (L) 4.22 - 5.81 MIL/uL   Hemoglobin 11.2 (L) 13.0 - 17.0 g/dL   HCT 33.3 (L) 39.0 - 52.0 %   MCV 89.0 78.0 - 100.0 fL   MCH 29.9 26.0 - 34.0 pg   MCHC 33.6 30.0 - 36.0 g/dL   RDW 15.3 11.5 - 15.5 %   Platelets 267 150 - 400 K/uL    Comment:  Performed at [facility], [place] [address], [place], [place] [address]  I-Stat Troponin, ED (not at [facility])     Status: None   Collection Time: 05/10/17 11:06 AM  Result Value Ref Range   Troponin i, poc 0.01 0.00 - 0.08 ng/mL   Comment 3            Comment: Due to the release kinetics of cTnI, a negative result within the first hours of the onset of symptoms does not rule out myocardial infarction with certainty. If myocardial infarction is still suspected, repeat the test at appropriate intervals.   I-stat troponin, ED     Status: None   Collection Time: 05/10/17  2:55 PM  Result Value Ref Range   Troponin i, poc 0.00 0.00 - 0.08 ng/mL   Comment 3            Comment: Due to the release kinetics of cTnI, a negative result within the first hours of the onset of symptoms does not rule out myocardial infarction with certainty. If myocardial infarction is still suspected, repeat the test at appropriate intervals.   Rapid urine drug screen (hospital performed)     Status: Abnormal   Collection Time: 05/10/17  3:11 PM  Result Value Ref Range   Opiates NONE DETECTED NONE DETECTED   Cocaine POSITIVE (A) NONE DETECTED   Benzodiazepines NONE DETECTED NONE DETECTED   Amphetamines NONE DETECTED NONE DETECTED   Tetrahydrocannabinol POSITIVE (A) NONE DETECTED   Barbiturates POSITIVE (A) NONE DETECTED    Comment: (NOTE) DRUG SCREEN FOR MEDICAL PURPOSES ONLY.  IF CONFIRMATION IS NEEDED FOR ANY PURPOSE, NOTIFY LAB WITHIN 5 DAYS. LOWEST DETECTABLE LIMITS FOR URINE DRUG SCREEN Drug Class  Cutoff (ng/mL) Amphetamine and metabolites    1000 Barbiturate and metabolites    200 Benzodiazepine                 027 Tricyclics and metabolites     300 Opiates and metabolites        300 Cocaine and metabolites        300 THC                            50 Performed at [facility], [place] [address], [place], [place] [address]   Basic metabolic panel     Status: Abnormal    Collection Time: 05/11/17  3:47 AM  Result Value Ref Range   Sodium 137 135 - 145 mmol/L   Potassium 3.3 (L) 3.5 - 5.1 mmol/L   Chloride 100 (L) 101 - 111 mmol/L   CO2 27 22 - 32 mmol/L   Glucose, Bld 88 65 - 99 mg/dL   BUN 5 (L) 6 - 20 mg/dL   Creatinine, Ser 0.95 0.61 - 1.24 mg/dL   Calcium 8.7 (L) 8.9 - 10.3 mg/dL   GFR calc non Af Amer >60 >60 mL/min   GFR calc Af Amer >60 >60 mL/min    Comment: (NOTE) The eGFR has been calculated using the CKD EPI equation. This calculation has not been validated in all clinical situations. eGFR's persistently <60 mL/min signify possible Chronic Kidney Disease.    Anion gap 10 5 - 15    Comment: Performed at [facility] [address], [place], [place] [address]  CBC     Status: Abnormal   Collection Time: 05/11/17  3:47 AM  Result Value Ref Range   WBC 7.7 4.0 - 10.5 K/uL   RBC 3.81 (L) 4.22 - 5.81 MIL/uL   Hemoglobin 11.0 (L) 13.0 - 17.0 g/dL   HCT 34.1 (L) 39.0 - 52.0 %   MCV 89.5 78.0 - 100.0 fL   MCH 28.9 26.0 - 34.0 pg   MCHC 32.3 30.0 - 36.0 g/dL   RDW 15.5 11.5 - 15.5 %   Platelets 244 150 - 400 K/uL    Comment: Performed at [facility], [place] [address] [place], [place] [address]  Hemoglobin A1c     Status: None   Collection Time: 05/11/17 11:53 AM  Result Value Ref Range   Hgb A1c MFr Bld 5.4 4.8 - 5.6 %    Comment: (NOTE) Pre diabetes:          5.7%-6.4% Diabetes:              >6.4% Glycemic control for   <7.0% adults with diabetes    Mean Plasma Glucose 108.28 mg/dL    Comment: Performed at [facility] [address] [place], [place] [address]    Current Facility-Administered Medications  Medication Dose Route Frequency Provider Last Rate Last Dose  . acetaminophen (TYLENOL) tablet 650 mg  650 mg Oral Q6H PRN Velna Ochs, MD   650 mg at 05/10/17 1835   Or  . acetaminophen (TYLENOL) suppository 650 mg  650 mg Rectal Q6H PRN Velna Ochs, MD      . albuterol (PROVENTIL) (2.5  MG/3ML) 0.083% nebulizer solution 2.5 mg  2.5 mg Nebulization Q6H PRN Velna Ochs, MD      . amLODipine (NORVASC) tablet 5 mg  5 mg Oral Daily Ina Homes, MD   5 mg at 05/11/17 1021  .  atorvastatin (LIPITOR) tablet 20 mg  20 mg Oral q1800 Ina Homes, MD   20 mg at 05/10/17 1835  . citalopram (CELEXA) tablet 20 mg  20 mg Oral Daily Velna Ochs, MD   20 mg at 05/11/17 0805  . enoxaparin (LOVENOX) injection 40 mg  40 mg Subcutaneous Q24H Velna Ochs, MD   40 mg at 05/10/17 2206  . feeding supplement (ENSURE ENLIVE) (ENSURE ENLIVE) liquid 237 mL  237 mL Oral BID BM Helberg, Justin, MD      . folic acid (FOLVITE) tablet 1 mg  1 mg Oral Daily Velna Ochs, MD   1 mg at 05/11/17 0805  . hydrocortisone cream 1 %   Topical BID PRN Colbert Ewing, MD      . metoprolol tartrate (LOPRESSOR) tablet 25 mg  25 mg Oral BID Ina Homes, MD   25 mg at 05/11/17 0805  . multivitamin with minerals tablet 1 tablet  1 tablet Oral Daily Velna Ochs, MD   1 tablet at 05/11/17 0804  . naphazoline-glycerin (CLEAR EYES REDNESS) ophth solution 1-2 drop  1-2 drop Both Eyes QID PRN Colbert Ewing, MD      . naproxen (NAPROSYN) tablet 500 mg  500 mg Oral BID WC Colbert Ewing, MD      . nicotine (NICODERM CQ - dosed in mg/24 hours) patch 21 mg  21 mg Transdermal Daily Ina Homes, MD   21 mg at 05/11/17 0806  . polyethylene glycol (MIRALAX / GLYCOLAX) packet 17 g  17 g Oral Daily Helberg, Justin, MD      . potassium chloride SA (K-DUR,KLOR-CON) CR tablet 40 mEq  40 mEq Oral Once Ina Homes, MD      . senna-docusate (Senokot-S) tablet 1 tablet  1 tablet Oral QHS PRN Velna Ochs, MD      . tamsulosin (FLOMAX) capsule 0.4 mg  0.4 mg Oral Daily Helberg, Justin, MD   0.4 mg at 05/11/17 1021  . thiamine (VITAMIN B-1) tablet 100 mg  100 mg Oral Daily Velna Ochs, MD   100 mg at 05/11/17 0805    Musculoskeletal: Strength & Muscle Tone: within normal limits Gait &  Station: normal Patient leans: N/A  Psychiatric Specialty Exam: Physical Exam  Constitutional: He is oriented to person, place, and time. He appears well-developed and well-nourished.  HENT:  Head: Normocephalic.  Respiratory: Effort normal.  Musculoskeletal: Normal range of motion.  Neurological: He is alert and oriented to person, place, and time.  Psychiatric: His speech is normal and behavior is normal. Judgment normal. Cognition and memory are normal. He exhibits a depressed mood. He expresses suicidal ideation. He expresses suicidal plans.    Review of Systems  Psychiatric/Behavioral: Positive for depression, substance abuse and suicidal ideas.  All other systems reviewed and are negative.   Blood pressure (!) 140/93, pulse (!) 58, temperature 98.6 F (37 C), temperature source Oral, resp. rate 18, height _0  (1.854 m), weight 86.7 kg (191 lb 3.2 oz), SpO2 99 %.Body mass index is 25.23 kg/m.  General Appearance: Casual  Eye Contact:  Fair  Speech:  Normal Rate  Volume:  Normal  Mood:  Depressed and Irritable  Affect:  Congruent  Thought Process:  Coherent and Descriptions of Associations: Intact  Orientation:  Full (Time, Place, and Person)  Thought Content:  Rumination  Suicidal Thoughts:  Yes.  with intent/plan  Homicidal Thoughts:  No  Memory:  Immediate;   Fair Recent;   Fair Remote;   Fair  Judgement:  Fair  Insight:  Fair  Psychomotor Activity:  Normal  Concentration:  Concentration: Fair and Attention Span: Fair  Recall:  [employer] of Knowledge:  Fair  Language:  Good  Akathisia:  No  Handed:  Right  AIMS (if indicated):     Assets:  Leisure Time Physical Health Resilience  ADL's:  Intact  Cognition:  WNL  Sleep:        Treatment Plan Summary: Daily contact with patient to assess and evaluate symptoms and progress in treatment, Medication management and Plan major depressive disorder, recurrent, severe without psychosis:  -Crisis  stabilization -Medication management:  Continue medical medications and increase citalopram from 20 mg daily to 40 mg daily for depression -Individual counseling  Disposition: Recommend psychiatric Inpatient admission when medically cleared.  Waylan Boga, NP 05/11/2017 12:47 PM

## 2017-05-11 NOTE — Progress Notes (Signed)
Patient ID: Jonathan Martin, male   DOB: 1955-06-06, 62 y.o.   MRN: [id number] Per State regulations 482.30 this chart was reviewed for medical necessity with respect to the patient's admission/duration of stay.    Next review date: 05/15/17  Thurman Coyer, BSN, RN-BC  Case Manager

## 2017-05-12 DIAGNOSIS — R45851 Suicidal ideations: Secondary | ICD-10-CM

## 2017-05-12 DIAGNOSIS — F1010 Alcohol abuse, uncomplicated: Secondary | ICD-10-CM

## 2017-05-12 DIAGNOSIS — F1410 Cocaine abuse, uncomplicated: Secondary | ICD-10-CM

## 2017-05-12 DIAGNOSIS — G621 Alcoholic polyneuropathy: Secondary | ICD-10-CM

## 2017-05-12 DIAGNOSIS — F1910 Other psychoactive substance abuse, uncomplicated: Secondary | ICD-10-CM

## 2017-05-12 DIAGNOSIS — F332 Major depressive disorder, recurrent severe without psychotic features: Principal | ICD-10-CM

## 2017-05-12 DIAGNOSIS — F1210 Cannabis abuse, uncomplicated: Secondary | ICD-10-CM

## 2017-05-12 DIAGNOSIS — R079 Chest pain, unspecified: Secondary | ICD-10-CM

## 2017-05-12 DIAGNOSIS — F17210 Nicotine dependence, cigarettes, uncomplicated: Secondary | ICD-10-CM

## 2017-05-12 MED ORDER — PNEUMOCOCCAL VAC POLYVALENT 25 MCG/0.5ML IJ INJ: 0.5000 mL | INJECTION | INTRAMUSCULAR | Status: DC

## 2017-05-12 NOTE — [facility] Suicide Risk Assessment (Signed)
[facility] INPATIENT:  Family/Significant Other Suicide Prevention Education  Suicide Prevention Education:  Patient Refusal for Family/Significant Other Suicide Prevention Education: The patient Jonathan Martin has refused to provide written consent for family/significant other to be provided Family/Significant Other Suicide Prevention Education during admission and/or prior to discharge.  Physician notified.  SPE completed with patient, as patient refused to consent to family contact. SPI pamphlet provided to pt and pt was encouraged to share information with support network, ask questions, and talk about any concerns relating to SPE. Patient denies access to guns/firearms and verbalized understanding of information provided. Mobile Crisis information also provided to patient.    Maeola Sarah 05/12/2017, 10:04 AM

## 2017-05-12 NOTE — H&P (Signed)
Psychiatric Admission Assessment Adult  Patient Identification: Jonathan Martin MRN:  [id number] Date of Evaluation:  05/12/2017 Chief Complaint:  MDD COCAINE USE DISORDER ALCOHOL USE DISORDER Principal Diagnosis: Major depressive disorder, recurrent episode, severe ([place]) Diagnosis:   Patient Active Problem List   Diagnosis Date Noted  . Major depressive disorder, recurrent episode, severe ([place]) [F33.2] 05/11/2017    Priority: High  . Polysubstance abuse ([place]) [F19.10] 05/11/2017    Priority: High  . Suicidal ideations [R45.851]   . 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 Chest pain [R07.9] 05/10/2017   History of Present Illness: Consult in the hospital:  62 yo male who came to the hospital for chest pain after using cocaine.  He continues to endorse suicidal ideations with a plan to overdose.  He reports daily use of alcohol, cocaine, and cannabis.  Last drink prior to coming to ED but drug panel negative for alcohol.  He wants his citalopram increased to 40 mg as the 20 mg dose just makes "me mad."  Some irritability on assessment.  Denies hallucinations, homicidal ideations, and withdrawal symptoms.  He reports he recently moved from [place] and is homeless.  He has been to rehab in the past but "it has been awhile." His wife left him two years ago and his life has spiraled downward since this time.  Today, he continues to be irritable and demanding of rehab/recovery for 6-12 months which has been explained is not an option here.  Denies suicidal ideations along with homicidal ideations,hallucinations.  Sleep was "not good", appetite is "fair", denies any withdrawal symptoms.  He is upset with the MD and is quick to anger.  He reports his citalopram was not increased and it makes him feel angry at 20 mg; however, it had been increased to 40 mg.  "I don't want to deal with that doctor, you can just give me my papers to go."    Associated Signs/Symptoms: Depression Symptoms:  depressed  mood, disturbed sleep, decreased appetite, (Hypo) Manic Symptoms:  none Anxiety Symptoms:  none Psychotic Symptoms:  none PTSD Symptoms: NA Total Time spent with patient: 45 minutes  Past Psychiatric History: depression, substance abuse  Is the patient at risk to self? No.  Has the patient been a risk to self in the past 6 months? Yes.    Has the patient been a risk to self within the distant past? Yes.    Is the patient a risk to others? No.  Has the patient been a risk to others in the past 6 months? No.  Has the patient been a risk to others within the distant past? No.   Prior Inpatient Therapy:  multiple times Prior Outpatient Therapy:  yes  Alcohol Screening: 1. How often do you have a drink containing alcohol?: 4 or more times a week 2. How many drinks containing alcohol do you have on a typical day when you are drinking?: 10 or more 3. How often do you have six or more drinks on one occasion?: Daily or almost daily AUDIT-C Score: 12 4. How often during the last year have you found that you were not able to stop drinking once you had started?: Daily or almost daily 5. How often during the last year have you failed to do what was normally expected from you becasue of drinking?: Daily or almost daily 6. How often during the last year have you needed a first drink in the morning to get yourself going after a heavy drinking session?: Daily  or almost daily 7. How often during the last year have you had a feeling of guilt of remorse after drinking?: Daily or almost daily 8. How often during the last year have you been unable to remember what happened the night before because you had been drinking?: Monthly 9. Have you or someone else been injured as a result of your drinking?: Yes, but not in the last year 10. Has a relative or friend or a doctor or another health worker been concerned about your drinking or suggested you cut down?: Yes, during the last year Alcohol Use Disorder  Identification Test Final Score (AUDIT): 36 Intervention/Follow-up: Alcohol Education Substance Abuse History in the last 12 months:  Yes.   Consequences of Substance Abuse: Family Consequences:  distancing of family members Previous Psychotropic Medications: No  Psychological Evaluations: Yes  Past Medical History:  Past Medical History:  Diagnosis Date  . Depression   . Hypertension    History reviewed. No pertinent surgical history. Family History: History reviewed. No pertinent family history. Family Psychiatric  History: none Tobacco Screening: Have you used any form of tobacco in the last 30 days? (Cigarettes, Smokeless Tobacco, Cigars, and/or Pipes): Yes Tobacco use, Select all that apply: 5 or more cigarettes per day Are you interested in Tobacco Cessation Medications?: Yes, will notify MD for an order Counseled patient on smoking cessation including recognizing danger situations, developing coping skills and basic information about quitting provided: Refused/Declined practical counseling Social History:  Social History   Substance and Sexual Activity  Alcohol Use Yes  . Alcohol/week: 10.8 oz  . Types: 12 Cans of beer, 6 Glasses of wine per week   Comment: drinks daily     Social History   Substance and Sexual Activity  Drug Use Yes  . Frequency: 7.0 times per week  . Types: Cocaine, Marijuana   Comment: uses daily on binges, whole paycheck    Additional Social History: Marital status: Divorced Divorced, when?: 2016 What types of issues is patient dealing with in the relationship?: Patient reports his alcoholism was the main issue Additional relationship information: N/A Are you sexually active?: Yes What is your sexual orientation?: Heeterosexual  Has your sexual activity been affected by drugs, alcohol, medication, or emotional stress?: No  Does patient have children?: No                         Allergies:   Allergies  Allergen Reactions  .  Lisinopril Swelling  . Peanut-Containing Drug Products Swelling   Lab Results:  Results for orders placed or performed during the hospital encounter of 05/10/17 (from the past 48 hour(s))  I-stat troponin, ED     Status: None   Collection Time: 05/10/17  2:55 PM  Result Value Ref Range   Troponin i, poc 0.00 0.00 - 0.08 ng/mL   Comment 3            Comment: Due to the release kinetics of cTnI, a negative result within the first hours of the onset of symptoms does not rule out myocardial infarction with certainty. If myocardial infarction is still suspected, repeat the test at appropriate intervals.   Rapid urine drug screen (hospital performed)     Status: Abnormal   Collection Time: 05/10/17  3:11 PM  Result Value Ref Range   Opiates NONE DETECTED NONE DETECTED   Cocaine POSITIVE (A) NONE DETECTED   Benzodiazepines NONE DETECTED NONE DETECTED   Amphetamines NONE DETECTED NONE DETECTED  Tetrahydrocannabinol POSITIVE (A) NONE DETECTED   Barbiturates POSITIVE (A) NONE DETECTED    Comment: (NOTE) DRUG SCREEN FOR MEDICAL PURPOSES ONLY.  IF CONFIRMATION IS NEEDED FOR ANY PURPOSE, NOTIFY LAB WITHIN 5 DAYS. LOWEST DETECTABLE LIMITS FOR URINE DRUG SCREEN Drug Class                     Cutoff (ng/mL) Amphetamine and metabolites    1000 Barbiturate and metabolites    200 Benzodiazepine                 242 Tricyclics and metabolites     300 Opiates and metabolites        300 Cocaine and metabolites        300 THC                            50 Performed at [facility], [place] [address], [place], [place] [address]   HIV antibody (Routine Testing)     Status: None   Collection Time: 05/11/17  3:47 AM  Result Value Ref Range   HIV Screen 4th Generation wRfx Non Reactive Non Reactive    Comment: (NOTE) Performed At: [facility] [address] [place], [place] [id number] Rush Farmer MD QP:[id number] Performed at [facility], [place] [address]  [place], [place] [address]   Basic metabolic panel     Status: Abnormal   Collection Time: 05/11/17  3:47 AM  Result Value Ref Range   Sodium 137 135 - 145 mmol/L   Potassium 3.3 (L) 3.5 - 5.1 mmol/L   Chloride 100 (L) 101 - 111 mmol/L   CO2 27 22 - 32 mmol/L   Glucose, Bld 88 65 - 99 mg/dL   BUN 5 (L) 6 - 20 mg/dL   Creatinine, Ser 0.95 0.61 - 1.24 mg/dL   Calcium 8.7 (L) 8.9 - 10.3 mg/dL   GFR calc non Af Amer >60 >60 mL/min   GFR calc Af Amer >60 >60 mL/min    Comment: (NOTE) The eGFR has been calculated using the CKD EPI equation. This calculation has not been validated in all clinical situations. eGFR's persistently <60 mL/min signify possible Chronic Kidney Disease.    Anion gap 10 5 - 15    Comment: Performed at [facility] [address] [place], [place] [address]  CBC     Status: Abnormal   Collection Time: 05/11/17  3:47 AM  Result Value Ref Range   WBC 7.7 4.0 - 10.5 K/uL   RBC 3.81 (L) 4.22 - 5.81 MIL/uL   Hemoglobin 11.0 (L) 13.0 - 17.0 g/dL   HCT 34.1 (L) 39.0 - 52.0 %   MCV 89.5 78.0 - 100.0 fL   MCH 28.9 26.0 - 34.0 pg   MCHC 32.3 30.0 - 36.0 g/dL   RDW 15.5 11.5 - 15.5 %   Platelets 244 150 - 400 K/uL    Comment: Performed at [facility], [place] [address], [place], [place] [address]  TSH     Status: None   Collection Time: 05/11/17 11:53 AM  Result Value Ref Range   TSH 0.448 0.350 - 4.500 uIU/mL    Comment: Performed by a 3rd Generation assay with a functional sensitivity of <=0.01 uIU/mL. Performed at [facility], [place] [address], [place], [place] [address]   Vitamin B12     Status: None   Collection Time: 05/11/17  11:53 AM  Result Value Ref Range   Vitamin B-12 655 180 - 914 pg/mL    Comment: (NOTE) This assay is not validated for testing neonatal or myeloproliferative syndrome specimens for Vitamin B12 levels. Performed at [facility], [place] [address] [place], [place] [address]   Hemoglobin A1c     Status: None    Collection Time: 05/11/17 11:53 AM  Result Value Ref Range   Hgb A1c MFr Bld 5.4 4.8 - 5.6 %    Comment: (NOTE) Pre diabetes:          5.7%-6.4% Diabetes:              >6.4% Glycemic control for   <7.0% adults with diabetes    Mean Plasma Glucose 108.28 mg/dL    Comment: Performed at [facility] [address] [place], [place] [address]    Blood Alcohol level:  Lab Results  Component Value Date   ETH <10 02/77/4128    Metabolic Disorder Labs:  Lab Results  Component Value Date   HGBA1C 5.4 05/11/2017   MPG 108.28 05/11/2017   No results found for: PROLACTIN No results found for: CHOL, TRIG, HDL, CHOLHDL, VLDL, LDLCALC  Current Medications: Current Facility-Administered Medications  Medication Dose Route Frequency Provider Last Rate Last Dose  . acetaminophen (TYLENOL) tablet 650 mg  650 mg Oral Q6H PRN Laverle Hobby, PA-C   650 mg at 05/11/17 2116  . albuterol (PROVENTIL HFA;VENTOLIN HFA) 108 (90 Base) MCG/ACT inhaler 2 puff  2 puff Inhalation Q4H PRN Laverle Hobby, PA-C      . alum & mag hydroxide-simeth (MAALOX/MYLANTA) 200-200-20 MG/5ML suspension 30 mL  30 mL Oral Q4H PRN Laverle Hobby, PA-C      . amLODipine (NORVASC) tablet 10 mg  10 mg Oral Daily Patriciaann Clan E, PA-C   10 mg at 05/12/17 7867  . atorvastatin (LIPITOR) tablet 20 mg  20 mg Oral q1800 Patriciaann Clan E, PA-C      . citalopram (CELEXA) tablet 40 mg  40 mg Oral Daily Patriciaann Clan E, PA-C   40 mg at 05/12/17 6720  . feeding supplement (ENSURE ENLIVE) (ENSURE ENLIVE) liquid 237 mL  237 mL Oral BID BM Cobos, Myer Peer, MD   237 mL at 05/12/17 1117  . hydrOXYzine (ATARAX/VISTARIL) tablet 25 mg  25 mg Oral Q6H PRN Patriciaann Clan E, PA-C      . magnesium hydroxide (MILK OF MAGNESIA) suspension 30 mL  30 mL Oral Daily PRN Patriciaann Clan E, PA-C      . metoprolol tartrate (LOPRESSOR) tablet 50 mg  50 mg Oral BID Patriciaann Clan E, PA-C   50 mg at 05/12/17 0834  . naproxen (NAPROSYN) tablet 500  mg  500 mg Oral BID WC Patriciaann Clan E, PA-C   500 mg at 05/12/17 0835  . nicotine (NICODERM CQ - dosed in mg/24 hours) patch 21 mg  21 mg Transdermal Daily Patriciaann Clan E, PA-C   21 mg at 05/12/17 0836  . pyridOXINE (VITAMIN B-6) tablet 100 mg  100 mg Oral Daily Laverle Hobby, PA-C      . tamsulosin (FLOMAX) capsule 0.4 mg  0.4 mg Oral QPC supper Laverle Hobby, PA-C      . thiamine (VITAMIN B-1) tablet 100 mg  100 mg Oral Daily Patriciaann Clan E, PA-C   100 mg at 05/12/17 0836  . traZODone (DESYREL) tablet 50 mg  50 mg Oral QHS,MR X 1 Patriciaann Clan  E, PA-C       PTA Medications: Medications Prior to Admission  Medication Sig Dispense Refill Last Dose  . Albuterol Sulfate 108 (90 Base) MCG/ACT AEPB Inhale 2 puffs into the lungs as needed.   05/06/2017 at prn  . amLODipine (NORVASC) 10 MG tablet Take 10 mg by mouth daily.   05/06/2017  . atorvastatin (LIPITOR) 20 MG tablet Take 20 mg by mouth daily at 6 PM.   05/06/2017  . citalopram (CELEXA) 40 MG tablet Take 40 mg by mouth daily.   05/06/2017  . ENSURE (ENSURE) Take 237 mLs by mouth 2 (two) times daily between meals.   05/06/2017  . famotidine (PEPCID) 20 MG tablet Take 20 mg by mouth 2 (two) times daily.   05/06/2017  . hydrocortisone cream 1 % Apply topically 2 (two) times daily as needed for itching. 30 g 0   . metoprolol tartrate (LOPRESSOR) 50 MG tablet Take 50 mg by mouth 2 (two) times daily.   05/06/2017  . Multiple Vitamin (MULTIVITAMIN) capsule Take 1 capsule by mouth daily.   05/06/2017  . naproxen (NAPROSYN) 500 MG tablet Take 500 mg by mouth as needed.    05/06/2017 at prn  . nicotine (NICODERM CQ - DOSED IN MG/24 HOURS) 21 mg/24hr patch Place 21 mg onto the skin daily.   05/06/2017  . pyridOXINE (VITAMIN B-6) 100 MG tablet Take 100 mg by mouth daily.   05/06/2017  . tamsulosin (FLOMAX) 0.4 MG CAPS capsule Take 0.4 mg by mouth.   05/06/2017  . thiamine 100 MG tablet Take 1 tablet (100 mg total) by mouth daily. 30 tablet 0      Musculoskeletal: Strength & Muscle Tone: within normal limits Gait & Station: normal Patient leans: N/A  Psychiatric Specialty Exam: Physical Exam  Constitutional: He is oriented to person, place, and time. He appears well-developed and well-nourished.  HENT:  Head: Normocephalic.  Neck: Normal range of motion.  Respiratory: Effort normal.  Musculoskeletal: Normal range of motion.  Neurological: He is alert and oriented to person, place, and time.  Psychiatric: His speech is normal and behavior is normal. Judgment and thought content normal. Cognition and memory are normal. He exhibits a depressed mood.    Review of Systems  Psychiatric/Behavioral: Positive for depression and substance abuse.  All other systems reviewed and are negative.   Blood pressure (!) 144/97, pulse (!) 59, temperature 98 F (36.7 C), temperature source Oral, resp. rate 18, height 6' (1.829 m), weight 87.5 kg (193 lb).Body mass index is 26.18 kg/m.  General Appearance: Casual  Eye Contact:  Good  Speech:  Normal Rate  Volume:  Normal  Mood:  Angry, Depressed and Irritable  Affect:  Congruent  Thought Process:  Coherent and Descriptions of Associations: Intact  Orientation:  Full (Time, Place, and Person)  Thought Content:  Rumination  Suicidal Thoughts:  No  Homicidal Thoughts:  No  Memory:  Immediate;   Fair Recent;   Fair Remote;   Fair  Judgement:  Fair  Insight:  Fair  Psychomotor Activity:  Normal  Concentration:  Concentration: Fair and Attention Span: Fair  Recall:  [employer] of Knowledge:  Fair  Language:  Good  Akathisia:  No  Handed:  Right  AIMS (if indicated):     Assets:  Leisure Time Physical Health Resilience  ADL's:  Intact  Cognition:  WNL  Sleep:  Number of Hours: 5.25    Treatment Plan Summary: Daily contact with patient to assess and evaluate  symptoms and progress in treatment, Medication management and Plan :  Major depressive disorder, recurrent, severe  without psychosis: -Continued Celexa 40 mg daily for depression  Insomnia: -Trazodone 50 mg at bedtime, repeat once in an hour if not effective for sleep, PRN  -Enrolled in group and individual therapy -Discharged planning started  Observation Level/Precautions:  15 minute checks  Laboratory:  completed in ED, reviewed, stable  Psychotherapy:  Individual and group therapy  Medications:  See MAR  Consultations:  None  Discharge Concerns:  Homelessness  Estimated LOS:  Other:     Physician Treatment Plan for Primary Diagnosis: Major depressive disorder, recurrent episode, severe ([place]) Long Term Goal(s): Improvement in symptoms so as ready for discharge  Short Term Goals: Ability to identify changes in lifestyle to reduce recurrence of condition will improve, Ability to verbalize feelings will improve, Ability to disclose and discuss suicidal ideas, Ability to demonstrate self-control will improve, Ability to identify and develop effective coping behaviors will improve, Ability to maintain clinical measurements within normal limits will improve, Compliance with prescribed medications will improve and Ability to identify triggers associated with substance abuse/mental health issues will improve  Physician Treatment Plan for Secondary Diagnosis: Principal Problem:   Major depressive disorder, recurrent episode, severe ([place]) Active Problems:   Polysubstance abuse ([place])  Long Term Goal(s): Improvement in symptoms so as ready for discharge  Short Term Goals: Ability to identify changes in lifestyle to reduce recurrence of condition will improve, Ability to verbalize feelings will improve, Ability to disclose and discuss suicidal ideas, Ability to demonstrate self-control will improve, Ability to identify and develop effective coping behaviors will improve, Ability to maintain clinical measurements within normal limits will improve, Compliance with prescribed medications will improve and Ability  to identify triggers associated with substance abuse/mental health issues will improve  I certify that inpatient services furnished can reasonably be expected to improve the patient's condition.    Waylan Boga, NP 5/7/201911:32 AM

## 2017-05-12 NOTE — Progress Notes (Signed)
At the beginning of the shift, pt was in the bed with eyes closed.  Writer went to pt's room for the shift assessment.  Pt awoke and answered writer's questions, but then said all he wanted was "peace and quiet".  He also declined the sleep aid as ordered for 2200 before bedtime stating that he did not need it.  Later, pt was up at the NS asking for gatorade, then at bedtime came to ask for the Ensure that he had refused during the afternoon, which he was given by Clinical research associate.  Pt presents as irritable and short tempered.  He is upset that supposedly the [facility] has lost his belongings.  He told Clinical research associate that he had called [facility] today and the items have still not been located.  He denies SI/HI/AVH.  He denies any withdrawal symptoms at this time.  He wants to be sent to a long term treatment program.  Support and encouragement offered.  Discharge plans are in process.  Safety maintained with q15 minute checks.

## 2017-05-12 NOTE — Progress Notes (Signed)
NUTRITION ASSESSMENT  Pt identified as at risk on the Malnutrition Screen Tool  INTERVENTION: 1. Educated patient on the importance of nutrition and encouraged intake of food and beverages. 2. Discussed weight goals. 3. Supplements: continue Ensure Enlive BID, each supplement provides 350 kcal and 20 grams of protein.  NUTRITION DIAGNOSIS: Unintentional weight loss related to sub-optimal intake as evidenced by pt report.   Goal: Pt to meet >/= 90% of their estimated nutrition needs.  Monitor:  PO intake  Assessment:  Pt admitted for alcohol abuse. Pt had reported that he is homeless and travels from town-to-town, staying in hotels. Pt also reported feelings of depression and SI.   No previous weight hx available in the chart and pt unable to weigh himself d/t living situation. Ensure Enlive ordered BID per ONS protocol at the time of admission. Continue to encourage PO intakes of meals, supplements, and snacks.     62 y.o. male  Height: Ht Readings from Last 1 Encounters:  05/11/17 6' (1.829 m)    Weight: Wt Readings from Last 1 Encounters:  05/11/17 193 lb (87.5 kg)    Weight Hx: Wt Readings from Last 10 Encounters:  05/11/17 193 lb (87.5 kg)  05/11/17 191 lb 3.2 oz (86.7 kg)    BMI:  Body mass index is 26.18 kg/m. Pt meets criteria for overweight based on current BMI.  Estimated Nutritional Needs: Kcal: 25-30 kcal/kg Protein: > 1 gram protein/kg Fluid: 1 ml/kcal  Diet Order:  Diet Order           Diet regular Room service appropriate? Yes; Fluid consistency: Thin  Diet effective now         Pt is also offered choice of unit snacks mid-morning and mid-afternoon.  Pt is eating as desired.   Lab results and medications reviewed.     Trenton Gammon, MS, RD, LDN, [facility] Inpatient Clinical Dietitian Pager # [phone] After hours/weekend pager # [phone]

## 2017-05-12 NOTE — Progress Notes (Signed)
D:Pt has been irritable this afternoon upset over his belongings. Pt came to [facility] with no belongings. He says that he was at [facility] and had a wallet with him along with other belongings. He rates depression as an 8 and anxiety as a 7 on 0-10 scale with 10 being the worst. He c/o hip/ shoulder pain and constipation. He given prn medication and prune juice. A:Offered support, encouragement and 15 minute checks. Pt was given number to call for [facility] lost and found.  R:Pt verbally denies si and hi this morning. Safety maintained on the unit.

## 2017-05-12 NOTE — Progress Notes (Signed)
Recreation Therapy Notes  Animal-Assisted Activity (AAA) Program Checklist/Progress Notes Patient Eligibility Criteria Checklist & Daily Group note for Rec Tx Intervention  Date: 5.7.19 Time: 1445 Location: 400 Hall Dayroom   AAA/T Program Assumption of Risk Form signed by Patient/ or Parent Legal Guardian YES   Patient is free of allergies or sever asthma YES   Patient reports no fear of animals YES   Patient reports no history of cruelty to animals YES   Patient understands his/her participation is voluntary YES   Patient washes hands before animal contact YES   Patient washes hands after animal contact YES   Behavioral Response: Engaged  Education: Hand Washing, Appropriate Animal Interaction   Education Outcome: Acknowledges understanding/In group clarification offered/Needs additional education.   Clinical Observations/Feedback: Pt did not attend group.    Darlen Gledhill, LRT/CTRS         Jonathan Martin 05/12/2017 4:03 PM 

## 2017-05-12 NOTE — BHH Suicide Risk Assessment (Signed)
[facility] Admission Suicide Risk Assessment   Nursing information obtained from:    Demographic factors:    Current Mental Status:    Loss Factors:    Historical Factors:    Risk Reduction Factors:     Total Time spent with patient: 45 minutes Principal Problem: <principal problem not specified> Diagnosis:   Patient Active Problem List   Diagnosis Date Noted  . Major depressive disorder, recurrent episode, severe (HCC) [F33.2] 05/11/2017  . Polysubstance abuse (HCC) [F19.10] 05/11/2017  . MDD (major depressive disorder), recurrent episode, severe (HCC) [F33.2] 05/11/2017  . Suicidal ideations [R45.851]   . 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 Chest pain [R07.9] 05/10/2017   Subjective Data: Patient is seen and examined.  Patient is a 62 year old male with a past psychiatric history significant for alcohol, cocaine, marijuana abuse as well as depression, hypertension, hyperlipidemia and osteoarthritis.  He presented initially to the [facility] emergency department with chest pain.  He was admitted to their unit.  He was ruled out for myocardial infarction.  He threatened suicide is transferred to our facility.  He states he wants a long-term program.  He stated he just got out of another hospital 3 weeks ago in [place].  He is very vague on what hospitalizations he had.  He admitted he had substance problems for the last 45 years.  He just moved to the [place] area.  He stated he is traveling around the state seeing all the cities" enjoying the beauty of our state".  He is more focused on medical problems currently.  He was admitted to the hospital for evaluation and stabilization.  Continued Clinical Symptoms:  Alcohol Use Disorder Identification Test Final Score (AUDIT): 36 The "Alcohol Use Disorders Identification Test", Guidelines for Use in Primary Care, Second Edition.  World Science writer [facility]). Score between 0-7:  no or low risk or alcohol related problems. Score  between 8-15:  moderate risk of alcohol related problems. Score between 16-19:  high risk of alcohol related problems. Score 20 or above:  warrants further diagnostic evaluation for alcohol dependence and treatment.   CLINICAL FACTORS:   Alcohol/Substance Abuse/Dependencies   Musculoskeletal: Strength & Muscle Tone: within normal limits Gait & Station: normal Patient leans: N/A  Psychiatric Specialty Exam: Physical Exam  Vitals reviewed. Constitutional: He is oriented to person, place, and time. He appears well-developed and well-nourished.  HENT:  Head: Normocephalic and atraumatic.  Respiratory: Effort normal.  Neurological: He is alert and oriented to person, place, and time.    Review of Systems  Musculoskeletal: Positive for back pain, joint pain and myalgias.  Neurological: Positive for tingling.  Psychiatric/Behavioral: Positive for substance abuse.    Blood pressure (!) 144/97, pulse (!) 59, temperature 98 F (36.7 C), temperature source Oral, resp. rate 18, height 6' (1.829 m), weight 87.5 kg (193 lb).Body mass index is 26.18 kg/m.  General Appearance: Casual  Eye Contact:  Good  Speech:  Clear and Coherent  Volume:  Normal  Mood:  Euthymic  Affect:  Congruent  Thought Process:  Coherent  Orientation:  Full (Time, Place, and Person)  Thought Content:  Logical  Suicidal Thoughts:  Yes.  without intent/plan  Homicidal Thoughts:  No  Memory:  Immediate;   Fair  Judgement:  Impaired  Insight:  Lacking  Psychomotor Activity:  Normal  Concentration:  Concentration: Fair  Recall:  [employer] of Knowledge:  Fair  Language:  Good  Akathisia:  No  Handed:  Right  AIMS (  if indicated):     Assets:  Resilience  ADL's:  Intact  Cognition:  WNL  Sleep:  Number of Hours: 5.25      COGNITIVE FEATURES THAT CONTRIBUTE TO RISK:  None    SUICIDE RISK:   Minimal: No identifiable suicidal ideation.  Patients presenting with no risk factors but with morbid  ruminations; may be classified as minimal risk based on the severity of the depressive symptoms  PLAN OF CARE: Patient is seen and examined.  Patient is a 62 year old male with a past psychiatric history significant for polysubstance use disorder.  He was transferred to our facility after undergoing a chest pain work-up.  He is requesting long-term placement.  He is hemodynamically stable at this time.  His antihypertensive medications have been continued.  He complains of osteoarthritic pain.  He stated he was in the hospital in [place] just 3 weeks ago, and then was discharged.  He stated at that time he did not think about going to a long-term facility.  He stated he just decided that he wants to do that.  He will be admitted to the hospital and monitor for withdrawal symptoms.  He will be monitored every 15 minutes.  He will be encouraged to attend groups.  He will meet with social work.  We will attempt to get his old records.  I certify that inpatient services furnished can reasonably be expected to improve the patient's condition.   Antonieta Pert, MD 05/12/2017, 10:27 AM

## 2017-05-12 NOTE — Plan of Care (Signed)
Pt continues to be anxious and irritable on the unit.

## 2017-05-12 NOTE — Progress Notes (Signed)
[facility] Group Notes:  (Nursing/MHT/Case Management/Adjunct)  Date:  05/12/2017  Time: 1515 Type of Therapy:  Psychoeducational Skills  Participation Level:  Did not attend  Participation Quality:    Affect:    Cognitive:    Insight:    Engagement in Group:    Modes of Intervention:    Summary of Progress/Problems:  Jonathan Martin 05/12/2017, 4:22 PM
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
## 2017-05-13 DIAGNOSIS — Z590 Homelessness: Secondary | ICD-10-CM

## 2017-05-13 DIAGNOSIS — F419 Anxiety disorder, unspecified: Secondary | ICD-10-CM

## 2017-05-13 DIAGNOSIS — G4700 Insomnia, unspecified: Secondary | ICD-10-CM

## 2017-05-13 NOTE — ED Notes (Signed)
Patients belongings were left on [address]. Found on 05/12/17 and patient was no longer here. Stanton Kidney T called security and the told me to call [facility]. I called AC and she told me to call security back. After calling behavior health they informed me that he was there. Security came and got patient belongings. It was a black back pack and a patient belonging bag. I didn't go through either so I don't know what all was in the bags.  Linus Salmons

## 2017-05-13 NOTE — Progress Notes (Signed)
Pt continues to be more focused on his missing belongings than his treatment.  He denies SI/HI/AVH at this time.  He denies having any withdrawal symptoms.  He says that all he wants is "peace and quiet", which he has been heard telling multiple people.  He is frustrated that if his belongings can't be found, he will need to replace his bank cards and ID.  Pt was encouraged to speak with the CSW for assistance in who to contact if that were the case.  Pt continues to be irritable and isolative, but he does make his needs known to staff.  Support and encouragement offered.  Discharge plans are in process.  Pt wants to go to an extended long term treatment program.  Safety maintained with q15 minute checks.

## 2017-05-13 NOTE — BHH Group Notes (Signed)
LCSW Group Therapy Note 05/13/2017 1:01 PM  Type of Therapy/Topic: Group Therapy: Emotion Regulation  Participation Level: Did Not Attend   Description of Group:  The purpose of this group is to assist patients in learning to regulate negative emotions and experience positive emotions. Patients will be guided to discuss ways in which they have been vulnerable to their negative emotions. These vulnerabilities will be juxtaposed with experiences of positive emotions or situations, and patients will be challenged to use positive emotions to combat negative ones. Special emphasis will be placed on coping with negative emotions in conflict situations, and patients will process healthy conflict resolution skills.  Therapeutic Goals: 1. Patient will identify two positive emotions or experiences to reflect on in order to balance out negative emotions 2. Patient will label two or more emotions that they find the most difficult to experience 3. Patient will demonstrate positive conflict resolution skills through discussion and/or role plays  Summary of Patient Progress:  Invited, chose not to attend.    Therapeutic Modalities:  Cognitive Behavioral Therapy Feelings Identification Dialectical Behavioral Therapy   Alcario Drought Clinical Social Worker

## 2017-05-13 NOTE — Progress Notes (Signed)
Pt did not attend goals group today. Seemed a bit agitated this morning.

## 2017-05-13 NOTE — Progress Notes (Signed)
D:Pt rates depression as a 9 and anxiety as an 8 on 0-10 scale with 10 being the most. Pt is sarcastic and irritable on the unit. Pt was referred to the  [facility] today concerning his belongings.  A:Offered support and 15 minute checks.  R:Pt verbally denied si and hi. Safety maintained on the unit.

## 2017-05-13 NOTE — Tx Team (Signed)
Interdisciplinary Treatment and Diagnostic Plan Update  05/13/2017 Time of Session: [id number] Jonathan Martin MRN: [id number]  Principal Diagnosis: Major depressive disorder, recurrent episode, severe (HCC)  Secondary Diagnoses: Principal Problem:   Major depressive disorder, recurrent episode, severe (HCC) Active Problems:   Polysubstance abuse (HCC)   Current Medications:  Current Facility-Administered Medications  Medication Dose Route Frequency Provider Last Rate Last Dose  . acetaminophen (TYLENOL) tablet 650 mg  650 mg Oral Q6H PRN Kerry Hough, PA-C   650 mg at 05/11/17 2116  . albuterol (PROVENTIL HFA;VENTOLIN HFA) 108 (90 Base) MCG/ACT inhaler 2 puff  2 puff Inhalation Q4H PRN Kerry Hough, PA-C      . alum & mag hydroxide-simeth (MAALOX/MYLANTA) 200-200-20 MG/5ML suspension 30 mL  30 mL Oral Q4H PRN Kerry Hough, PA-C      . amLODipine (NORVASC) tablet 10 mg  10 mg Oral Daily Donell Sievert E, PA-C   10 mg at 05/13/17 9562  . atorvastatin (LIPITOR) tablet 20 mg  20 mg Oral q1800 Kerry Hough, PA-C   20 mg at 05/12/17 1735  . citalopram (CELEXA) tablet 40 mg  40 mg Oral Daily Kerry Hough, PA-C   40 mg at 05/13/17 1308  . feeding supplement (ENSURE ENLIVE) (ENSURE ENLIVE) liquid 237 mL  237 mL Oral BID BM Cobos, Rockey Situ, MD   237 mL at 05/12/17 2128  . hydrOXYzine (ATARAX/VISTARIL) tablet 25 mg  25 mg Oral Q6H PRN Donell Sievert E, PA-C      . magnesium hydroxide (MILK OF MAGNESIA) suspension 30 mL  30 mL Oral Daily PRN Donell Sievert E, PA-C   30 mL at 05/12/17 1318  . metoprolol tartrate (LOPRESSOR) tablet 50 mg  50 mg Oral BID Kerry Hough, PA-C   50 mg at 05/13/17 6578  . naproxen (NAPROSYN) tablet 500 mg  500 mg Oral BID WC Donell Sievert E, PA-C   500 mg at 05/13/17 4696  . nicotine (NICODERM CQ - dosed in mg/24 hours) patch 21 mg  21 mg Transdermal Daily Donell Sievert E, PA-C   21 mg at 05/13/17 2952  . pneumococcal 23 valent vaccine (PNU-IMMUNE)  injection 0.5 mL  0.5 mL Intramuscular Tomorrow-1000 Nwoko, Agnes I, NP      . pyridOXINE (VITAMIN B-6) tablet 100 mg  100 mg Oral Daily Donell Sievert E, PA-C   100 mg at 05/13/17 8413  . tamsulosin (FLOMAX) capsule 0.4 mg  0.4 mg Oral QPC supper Kerry Hough, PA-C   0.4 mg at 05/12/17 1735  . thiamine (VITAMIN B-1) tablet 100 mg  100 mg Oral Daily Kerry Hough, PA-C   100 mg at 05/13/17 2440  . traZODone (DESYREL) tablet 50 mg  50 mg Oral QHS,MR X 1 Simon, Spencer E, PA-C       PTA Medications: Medications Prior to Admission  Medication Sig Dispense Refill Last Dose  . Albuterol Sulfate 108 (90 Base) MCG/ACT AEPB Inhale 2 puffs into the lungs as needed.   05/06/2017 at prn  . amLODipine (NORVASC) 10 MG tablet Take 10 mg by mouth daily.   05/06/2017  . atorvastatin (LIPITOR) 20 MG tablet Take 20 mg by mouth daily at 6 PM.   05/06/2017  . citalopram (CELEXA) 40 MG tablet Take 40 mg by mouth daily.   05/06/2017  . ENSURE (ENSURE) Take 237 mLs by mouth 2 (two) times daily between meals.   05/06/2017  . famotidine (PEPCID) 20 MG tablet Take 20 mg by mouth  2 (two) times daily.   05/06/2017  . hydrocortisone cream 1 % Apply topically 2 (two) times daily as needed for itching. 30 g 0   . metoprolol tartrate (LOPRESSOR) 50 MG tablet Take 50 mg by mouth 2 (two) times daily.   05/06/2017  . Multiple Vitamin (MULTIVITAMIN) capsule Take 1 capsule by mouth daily.   05/06/2017  . naproxen (NAPROSYN) 500 MG tablet Take 500 mg by mouth as needed.    05/06/2017 at prn  . nicotine (NICODERM CQ - DOSED IN MG/24 HOURS) 21 mg/24hr patch Place 21 mg onto the skin daily.   05/06/2017  . pyridOXINE (VITAMIN B-6) 100 MG tablet Take 100 mg by mouth daily.   05/06/2017  . tamsulosin (FLOMAX) 0.4 MG CAPS capsule Take 0.4 mg by mouth.   05/06/2017  . thiamine 100 MG tablet Take 1 tablet (100 mg total) by mouth daily. 30 tablet 0     Patient Stressors: Medication change or noncompliance Substance abuse  Patient Strengths: Ability  for insight Average or above average intelligence Capable of independent living General fund of knowledge Motivation for treatment/growth  Treatment Modalities: Medication Management, Group therapy, Case management,  1 to 1 session with clinician, Psychoeducation, Recreational therapy.   Physician Treatment Plan for Primary Diagnosis: Major depressive disorder, recurrent episode, severe (HCC) Long Term Goal(s): Improvement in symptoms so as ready for discharge Improvement in symptoms so as ready for discharge   Short Term Goals: Ability to identify changes in lifestyle to reduce recurrence of condition will improve Ability to verbalize feelings will improve Ability to disclose and discuss suicidal ideas Ability to demonstrate self-control will improve Ability to identify and develop effective coping behaviors will improve Ability to maintain clinical measurements within normal limits will improve Compliance with prescribed medications will improve Ability to identify triggers associated with substance abuse/mental health issues will improve Ability to identify changes in lifestyle to reduce recurrence of condition will improve Ability to verbalize feelings will improve Ability to disclose and discuss suicidal ideas Ability to demonstrate self-control will improve Ability to identify and develop effective coping behaviors will improve Ability to maintain clinical measurements within normal limits will improve Compliance with prescribed medications will improve Ability to identify triggers associated with substance abuse/mental health issues will improve  Medication Management: Evaluate patient's response, side effects, and tolerance of medication regimen.  Therapeutic Interventions: 1 to 1 sessions, Unit Group sessions and Medication administration.  Evaluation of Outcomes: Progressing  Physician Treatment Plan for Secondary Diagnosis: Principal Problem:   Major depressive  disorder, recurrent episode, severe (HCC) Active Problems:   Polysubstance abuse (HCC)  Long Term Goal(s): Improvement in symptoms so as ready for discharge Improvement in symptoms so as ready for discharge   Short Term Goals: Ability to identify changes in lifestyle to reduce recurrence of condition will improve Ability to verbalize feelings will improve Ability to disclose and discuss suicidal ideas Ability to demonstrate self-control will improve Ability to identify and develop effective coping behaviors will improve Ability to maintain clinical measurements within normal limits will improve Compliance with prescribed medications will improve Ability to identify triggers associated with substance abuse/mental health issues will improve Ability to identify changes in lifestyle to reduce recurrence of condition will improve Ability to verbalize feelings will improve Ability to disclose and discuss suicidal ideas Ability to demonstrate self-control will improve Ability to identify and develop effective coping behaviors will improve Ability to maintain clinical measurements within normal limits will improve Compliance with prescribed medications will improve Ability  to identify triggers associated with substance abuse/mental health issues will improve     Medication Management: Evaluate patient's response, side effects, and tolerance of medication regimen.  Therapeutic Interventions: 1 to 1 sessions, Unit Group sessions and Medication administration.  Evaluation of Outcomes: Progressing   RN Treatment Plan for Primary Diagnosis: Major depressive disorder, recurrent episode, severe (HCC) Long Term Goal(s): Knowledge of disease and therapeutic regimen to maintain health will improve  Short Term Goals: Ability to verbalize frustration and anger appropriately will improve, Ability to verbalize feelings will improve and Ability to identify and develop effective coping behaviors will  improve  Medication Management: RN will administer medications as ordered by provider, will assess and evaluate patient's response and provide education to patient for prescribed medication. RN will report any adverse and/or side effects to prescribing provider.  Therapeutic Interventions: 1 on 1 counseling sessions, Psychoeducation, Medication administration, Evaluate responses to treatment, Monitor vital signs and CBGs as ordered, Perform/monitor CIWA, COWS, AIMS and Fall Risk screenings as ordered, Perform wound care treatments as ordered.  Evaluation of Outcomes: Progressing   LCSW Treatment Plan for Primary Diagnosis: Major depressive disorder, recurrent episode, severe (HCC) Long Term Goal(s): Safe transition to appropriate next level of care at discharge, Engage patient in therapeutic group addressing interpersonal concerns.  Short Term Goals: Engage patient in aftercare planning with referrals and resources, Facilitate patient progression through stages of change regarding substance use diagnoses and concerns and Identify triggers associated with mental health/substance abuse issues  Therapeutic Interventions: Assess for all discharge needs, 1 to 1 time with Social worker, Explore available resources and support systems, Assess for adequacy in community support network, Educate family and significant other(s) on suicide prevention, Complete Psychosocial Assessment, Interpersonal group therapy.  Evaluation of Outcomes: Progressing   Progress in Treatment: Attending groups: No.  Participating in groups: No. Taking medication as prescribed: Yes. Toleration medication: Yes. Family/Significant other contact made: SPE completed with pt; pt declined to consent to collateral contact.  Patient understands diagnosis: Yes. Discussing patient identified problems/goals with staff: Yes. Medical problems stabilized or resolved: Yes. Denies suicidal/homicidal ideation: Yes. Issues/concerns per  patient self-inventory: No. Other: n/a   New problem(s) identified: No, Describe:  n/a  New Short Term/Long Term Goal(s): detox, medication management for mood stabilization; elimination of SI thoughts; development of comprehensive mental wellness/sobriety plan.   Patient Goal:  "To get into long term treatment-a year or longer."   Discharge Plan or Barriers: CSW assessing for appropriate referrals. Pt is open to [facility]; [facility] for outpatient. CSW exploring all options for pt.   Reason for Continuation of Hospitalization: Anxiety Depression Medication stabilization Suicidal ideation  Estimated Length of Stay: Thursday, 05/14/17  Attendees: Patient: Jonathan Martin 05/13/2017 9:08 AM  Physician: Dr. Jola Babinski MD; Dr. Altamese  MD 05/13/2017 9:08 AM  Nursing: Vanessa Kick RN; Clydie Braun RN 05/13/2017 9:08 AM  RN Care Manager:x 05/13/2017 9:08 AM  Social Worker: Chartered loss adjuster, LCSW 05/13/2017 9:08 AM  Recreational Therapist: x 05/13/2017 9:08 AM  Other: Armandina Stammer NP; Nanine Means NP 05/13/2017 9:08 AM  Other:  05/13/2017 9:08 AM  Other: 05/13/2017 9:08 AM    Scribe for Treatment Team: Ledell Peoples Smart, LCSW 05/13/2017 9:08 AM

## 2017-05-13 NOTE — Progress Notes (Signed)
Recreation Therapy Notes  Date: 5.8.19 Time: 0930 Location: [address] Dayroom  Group Topic: Stress Management  Goal Area(s) Addresses:  Patient will verbalize importance of using healthy stress management.  Patient will identify positive emotions associated with healthy stress management.   Intervention: Stress Management  Activity :  Meditation.  LRT introduced the stress management technique of meditation.  LRT played a meditation that focused on the strength and resilience of mountains and how those characteristics can be used in daily life.  Patients were to follow along as meditation played.  Education:  Stress Management, Discharge Planning.   Education Outcome: Acknowledges edcuation/In group clarification offered/Needs additional education  Clinical Observations/Feedback: Pt did not attend group.      Coila Wardell Linday, LRT/CTRS         Caroll Rancher A 05/13/2017 11:40 AM

## 2017-05-13 NOTE — Progress Notes (Signed)
[facility] MD Progress Note  05/13/2017 1:48 PM Jonathan Martin  MRN:  [id number] Subjective:  "I feel better.  Need somewhere to stay.  I'm not learning nothing.  I'm not getting anything out of here. I was just at [facility], I know all of this.  I'm just looking for my next place to go."  He continues to endorse a 10/10 of depression today despite saying he felt better.  Continues to focus on his social issues; homelessness and unemployed.  No vestment into treatment for depression or substance abuse.  Not attending groups and sleeping in his room.  Irritable and quick to anger.  He does like the Celexa and is compliant. Principal Problem: Major depressive disorder, recurrent episode, severe (HCC) Diagnosis:   Patient Active Problem List   Diagnosis Date Noted  . Major depressive disorder, recurrent episode, severe (HCC) [F33.2] 05/11/2017    Priority: High  . Polysubstance abuse (HCC) [F19.10] 05/11/2017    Priority: High  . Suicidal ideations [R45.851]   . 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 Chest pain [R07.9] 05/10/2017   Total Time spent with patient: 30 minutes  Past Psychiatric History: substance abuse, depression  Past Medical History:  Past Medical History:  Diagnosis Date  . Depression   . Hypertension    History reviewed. No pertinent surgical history. Family History: History reviewed. No pertinent family history. Family Psychiatric  History: none Social History:  Social History   Substance and Sexual Activity  Alcohol Use Yes  . Alcohol/week: 10.8 oz  . Types: 12 Cans of beer, 6 Glasses of wine per week   Comment: drinks daily     Social History   Substance and Sexual Activity  Drug Use Yes  . Frequency: 7.0 times per week  . Types: Cocaine, Marijuana   Comment: uses daily on binges, whole paycheck    Social History   Socioeconomic History  . Marital status: Divorced    Spouse name: Not on file  . Number of children: Not on file  . Years of education: Not  on file  . Highest education level: Not on file  Occupational History  . Not on file  Social Needs  . Financial resource strain: Not on file  . Food insecurity:    Worry: Not on file    Inability: Not on file  . Transportation needs:    Medical: Not on file    Non-medical: Not on file  Tobacco Use  . Smoking status: Current Every Day Smoker    Packs/day: 1.00  . Smokeless tobacco: Current User  Substance and Sexual Activity  . Alcohol use: Yes    Alcohol/week: 10.8 oz    Types: 12 Cans of beer, 6 Glasses of wine per week    Comment: drinks daily  . Drug use: Yes    Frequency: 7.0 times per week    Types: Cocaine, Marijuana    Comment: uses daily on binges, whole paycheck  . Sexual activity: Yes  Lifestyle  . Physical activity:    Days per week: Not on file    Minutes per session: Not on file  . Stress: Not on file  Relationships  . Social connections:    Talks on phone: Not on file    Gets together: Not on file    Attends religious service: Not on file    Active member of club or organization: Not on file    Attends meetings of clubs or organizations: Not on file  Relationship status: Not on file  Other Topics Concern  . Not on file  Social History Narrative  . Not on file   Additional Social History:      Sleep: Good  Appetite:  Good  Current Medications: Current Facility-Administered Medications  Medication Dose Route Frequency Provider Last Rate Last Dose  . acetaminophen (TYLENOL) tablet 650 mg  650 mg Oral Q6H PRN Kerry Hough, PA-C   650 mg at 05/11/17 2116  . albuterol (PROVENTIL HFA;VENTOLIN HFA) 108 (90 Base) MCG/ACT inhaler 2 puff  2 puff Inhalation Q4H PRN Kerry Hough, PA-C      . alum & mag hydroxide-simeth (MAALOX/MYLANTA) 200-200-20 MG/5ML suspension 30 mL  30 mL Oral Q4H PRN Kerry Hough, PA-C      . amLODipine (NORVASC) tablet 10 mg  10 mg Oral Daily Donell Sievert E, PA-C   10 mg at 05/13/17 1610  . atorvastatin (LIPITOR)  tablet 20 mg  20 mg Oral q1800 Kerry Hough, PA-C   20 mg at 05/12/17 1735  . citalopram (CELEXA) tablet 40 mg  40 mg Oral Daily Kerry Hough, PA-C   40 mg at 05/13/17 9604  . feeding supplement (ENSURE ENLIVE) (ENSURE ENLIVE) liquid 237 mL  237 mL Oral BID BM Cobos, Rockey Situ, MD   237 mL at 05/13/17 1312  . hydrOXYzine (ATARAX/VISTARIL) tablet 25 mg  25 mg Oral Q6H PRN Donell Sievert E, PA-C      . magnesium hydroxide (MILK OF MAGNESIA) suspension 30 mL  30 mL Oral Daily PRN Donell Sievert E, PA-C   30 mL at 05/12/17 1318  . metoprolol tartrate (LOPRESSOR) tablet 50 mg  50 mg Oral BID Kerry Hough, PA-C   50 mg at 05/13/17 5409  . naproxen (NAPROSYN) tablet 500 mg  500 mg Oral BID WC Donell Sievert E, PA-C   500 mg at 05/13/17 8119  . nicotine (NICODERM CQ - dosed in mg/24 hours) patch 21 mg  21 mg Transdermal Daily Donell Sievert E, PA-C   21 mg at 05/13/17 1478  . pneumococcal 23 valent vaccine (PNU-IMMUNE) injection 0.5 mL  0.5 mL Intramuscular Tomorrow-1000 Nwoko, Agnes I, NP      . pyridOXINE (VITAMIN B-6) tablet 100 mg  100 mg Oral Daily Donell Sievert E, PA-C   100 mg at 05/13/17 2956  . tamsulosin (FLOMAX) capsule 0.4 mg  0.4 mg Oral QPC supper Kerry Hough, PA-C   0.4 mg at 05/12/17 1735  . thiamine (VITAMIN B-1) tablet 100 mg  100 mg Oral Daily Kerry Hough, PA-C   100 mg at 05/13/17 2130  . traZODone (DESYREL) tablet 50 mg  50 mg Oral QHS,MR X 1 Simon, Spencer E, PA-C        Lab Results: No results found for this or any previous visit (from the past 48 hour(s)).  Blood Alcohol level:  Lab Results  Component Value Date   ETH <10 05/10/2017    Metabolic Disorder Labs: Lab Results  Component Value Date   HGBA1C 5.4 05/11/2017   MPG 108.28 05/11/2017   No results found for: PROLACTIN No results found for: CHOL, TRIG, HDL, CHOLHDL, VLDL, LDLCALC  Physical Findings: AIMS: Facial and Oral Movements Muscles of Facial Expression: None, normal Lips and  Perioral Area: None, normal Jaw: None, normal Tongue: None, normal,Extremity Movements Upper (arms, wrists, hands, fingers): None, normal Lower (legs, knees, ankles, toes): None, normal, Trunk Movements Neck, shoulders, hips: None, normal, Overall Severity Severity of abnormal  movements (highest score from questions above): None, normal Incapacitation due to abnormal movements: None, normal Patient's awareness of abnormal movements (rate only patient's report): No Awareness, Dental Status Current problems with teeth and/or dentures?: No Does patient usually wear dentures?: No  CIWA:    COWS:     Musculoskeletal: Strength & Muscle Tone: within normal limits Gait & Station: normal Patient leans: N/A  Psychiatric Specialty Exam: Physical Exam  Constitutional: He is oriented to person, place, and time. He appears well-developed and well-nourished.  HENT:  Head: Normocephalic.  Neck: Normal range of motion.  Respiratory: Effort normal.  Musculoskeletal: Normal range of motion.  Neurological: He is alert and oriented to person, place, and time.  Psychiatric: His speech is normal and behavior is normal. Judgment and thought content normal. Cognition and memory are normal. He exhibits a depressed mood.    Review of Systems  Psychiatric/Behavioral: Positive for depression and substance abuse.  All other systems reviewed and are negative.   Blood pressure 100/82, pulse 76, temperature 98.7 F (37.1 C), temperature source Oral, resp. rate 18, height 6' (1.829 m), weight 87.5 kg (193 lb).Body mass index is 26.18 kg/m.  General Appearance: Casual  Eye Contact:  Good  Speech:  Normal Rate  Volume:  Normal  Mood:  Depressed and Irritable  Affect:  Congruent  Thought Process:  Coherent and Descriptions of Associations: Intact  Orientation:  Full (Time, Place, and Person)  Thought Content:  Rumination  Suicidal Thoughts:  No  Homicidal Thoughts:  No  Memory:  Immediate;   Fair Recent;    Fair Remote;   Fair  Judgement:  Fair  Insight:  Lacking  Psychomotor Activity:  Decreased  Concentration:  Concentration: Fair and Attention Span: Fair  Recall:  [employer] of Knowledge:  Fair  Language:  Good  Akathisia:  No  Handed:  Right  AIMS (if indicated):     Assets:  Leisure Time Physical Health Resilience  ADL's:  Intact  Cognition:  WNL  Sleep:  Number of Hours: 6.75     Treatment Plan Summary: Daily contact with patient to assess and evaluate symptoms and progress in treatment, Medication management and Plan :  Major depressive disorder, recurrent, severe without psychosis: -Continue Celexa 40 mg daily for depression  Insomnia -Trazodone 50 mg at bedtime PRN sleep, repeat once if not effective  Anxiety -Hydroxyzine 25 mg every six hours PRN anxiety  -Continue group and individual therapy -Continue discharge plans   Nanine Means, NP 05/13/2017, 1:48 PM

## 2017-05-14 MED ORDER — TAMSULOSIN HCL 0.4 MG PO CAPS: 0.4000 mg | ORAL_CAPSULE | Freq: Every day | ORAL | 0 refills | Status: DC

## 2017-05-14 MED ORDER — CITALOPRAM HYDROBROMIDE 40 MG PO TABS: 40.0000 mg | ORAL_TABLET | Freq: Every day | ORAL | 0 refills | Status: DC

## 2017-05-14 MED ORDER — FAMOTIDINE 20 MG PO TABS: 20.0000 mg | ORAL_TABLET | Freq: Two times a day (BID) | ORAL | 0 refills | Status: AC

## 2017-05-14 MED ORDER — TRAZODONE HCL 50 MG PO TABS: 50.0000 mg | ORAL_TABLET | Freq: Every evening | ORAL | 0 refills | Status: DC | PRN

## 2017-05-14 MED ORDER — ATORVASTATIN CALCIUM 20 MG PO TABS: 20.0000 mg | ORAL_TABLET | Freq: Every day | ORAL | 0 refills | Status: DC

## 2017-05-14 MED ORDER — HYDROXYZINE HCL 25 MG PO TABS: 25.0000 mg | ORAL_TABLET | Freq: Four times a day (QID) | ORAL | 0 refills | Status: AC | PRN

## 2017-05-14 MED ORDER — ALBUTEROL SULFATE HFA 108 (90 BASE) MCG/ACT IN AERS: 2.0000 | INHALATION_SPRAY | RESPIRATORY_TRACT | 0 refills | Status: DC | PRN

## 2017-05-14 MED ORDER — METOPROLOL TARTRATE 50 MG PO TABS: 50.0000 mg | ORAL_TABLET | Freq: Two times a day (BID) | ORAL | 0 refills | Status: DC

## 2017-05-14 MED ORDER — AMLODIPINE BESYLATE 10 MG PO TABS: 10.0000 mg | ORAL_TABLET | Freq: Every day | ORAL | 0 refills | Status: DC

## 2017-05-14 NOTE — [facility] Suicide Risk Assessment (Signed)
[facility] Discharge Suicide Risk Assessment   Principal Problem: Major depressive disorder, recurrent episode, severe (HCC) Discharge Diagnoses:  Patient Active Problem List   Diagnosis Date Noted  . Major depressive disorder, recurrent episode, severe (HCC) [F33.2] 05/11/2017  . Polysubstance abuse (HCC) [F19.10] 05/11/2017  . Suicidal ideations [R45.851]   . 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 Chest pain [R07.9] 05/10/2017    Total Time spent with patient: 30 minutes  Musculoskeletal: Strength & Muscle Tone: within normal limits Gait & Station: normal Patient leans: N/A  Psychiatric Specialty Exam: Review of Systems  All other systems reviewed and are negative.   Blood pressure 120/85, pulse 71, temperature 98.7 F (37.1 C), temperature source Oral, resp. rate 18, height 6' (1.829 m), weight 87.5 kg (193 lb).Body mass index is 26.18 kg/m.  General Appearance: Casual  Eye Contact::  Good  Speech:  Normal Rate409  Volume:  Normal  Mood:  Irritable  Affect:  Congruent  Thought Process:  Coherent  Orientation:  Full (Time, Place, and Person)  Thought Content:  Logical  Suicidal Thoughts:  No  Homicidal Thoughts:  No  Memory:  Immediate;   Fair  Judgement:  Intact  Insight:  Lacking  Psychomotor Activity:  Normal  Concentration:  Fair  Recall:  Good  Fund of Knowledge:Good  Language: Good  Akathisia:  No  Handed:  Right  AIMS (if indicated):     Assets:  Desire for Improvement Physical Health Resilience  Sleep:  Number of Hours: 4.25  Cognition: WNL  ADL's:  Intact   Mental Status Per Nursing Assessment::   On Admission:     Demographic Factors:  Male, Low socioeconomic status and Unemployed  Loss Factors: NA  Historical Factors: Impulsivity  Risk Reduction Factors:   NA  Continued Clinical Symptoms:  Alcohol/Substance Abuse/Dependencies  Cognitive Features That Contribute To Risk:  None    Suicide Risk:  Minimal: No identifiable suicidal  ideation.  Patients presenting with no risk factors but with morbid ruminations; may be classified as minimal risk based on the severity of the depressive symptoms    Plan Of Care/Follow-up recommendations:  Activity:  ad lib  Antonieta Pert, MD 05/14/2017, 8:09 AM

## 2017-05-14 NOTE — Discharge Summary (Signed)
Physician Discharge Summary Note  Patient:  Jonathan Martin is an 62 y.o., male MRN:  [id number] DOB:  [birth year] Patient phone:  [phone] (home)  Patient address:   [place] [address],  Total Time spent with patient: 20 minutes  Date of Admission:  05/11/2017 Date of Discharge: 05/14/17  Reason for Admission:  Worsening depression with SI   Principal Problem: Major depressive disorder, recurrent episode, severe [facility]) Discharge Diagnoses: Patient Active Problem List   Diagnosis Date Noted  . Major depressive disorder, recurrent episode, severe (HCC) [F33.2] 05/11/2017  . Polysubstance abuse (HCC) [F19.10] 05/11/2017  . Suicidal ideations [R45.851]   . 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 Chest pain [R07.9] 05/10/2017    Past Psychiatric History: depression, substance abuse   Past Medical History:  Past Medical History:  Diagnosis Date  . Depression   . Hypertension    History reviewed. No pertinent surgical history. Family History: History reviewed. No pertinent family history. Family Psychiatric  History: None Social History:  Social History   Substance and Sexual Activity  Alcohol Use Yes  . Alcohol/week: 10.8 oz  . Types: 12 Cans of beer, 6 Glasses of wine per week   Comment: drinks daily     Social History   Substance and Sexual Activity  Drug Use Yes  . Frequency: 7.0 times per week  . Types: Cocaine, Marijuana   Comment: uses daily on binges, whole paycheck    Social History   Socioeconomic History  . Marital status: Divorced    Spouse name: Not on file  . Number of children: Not on file  . Years of education: Not on file  . Highest education level: Not on file  Occupational History  . Not on file  Social Needs  . Financial resource strain: Not on file  . Food insecurity:    Worry: Not on file    Inability: Not on file  . Transportation needs:    Medical: Not on file    Non-medical: Not on file  Tobacco Use  . Smoking  status: Current Every Day Smoker    Packs/day: 1.00  . Smokeless tobacco: Current User  Substance and Sexual Activity  . Alcohol use: Yes    Alcohol/week: 10.8 oz    Types: 12 Cans of beer, 6 Glasses of wine per week    Comment: drinks daily  . Drug use: Yes    Frequency: 7.0 times per week    Types: Cocaine, Marijuana    Comment: uses daily on binges, whole paycheck  . Sexual activity: Yes  Lifestyle  . Physical activity:    Days per week: Not on file    Minutes per session: Not on file  . Stress: Not on file  Relationships  . Social connections:    Talks on phone: Not on file    Gets together: Not on file    Attends religious service: Not on file    Active member of club or organization: Not on file    Attends meetings of clubs or organizations: Not on file    Relationship status: Not on file  Other Topics Concern  . Not on file  Social History Narrative  . Not on file    Hospital Course:   05/12/17 [facility] MD Assessment: Consult in the hospital:  62 yo male who came to the hospital for chest pain after using cocaine. He continues to endorse suicidal ideations with a plan to overdose. He reports daily use of alcohol, cocaine, and  cannabis. Last drink prior to coming to ED but drug panel negative for alcohol. He wants his citalopram increased to 40 mg as the 20 mg dose just makes "me mad." Some irritability on assessment. Denies hallucinations, homicidal ideations, and withdrawal symptoms. He reports he recently moved from [place] and is homeless. He has been to rehab in the past but "it has been awhile." His wife left him two years ago and his life has spiraled downward since this time. Today, he continues to be irritable and demanding of rehab/recovery for 6-12 months which has been explained is not an option here.  Denies suicidal ideations along with homicidal ideations,hallucinations.  Sleep was "not good", appetite is "fair", denies any withdrawal symptoms.  He is upset  with the MD and is quick to anger.  He reports his citalopram was not increased and it makes him feel angry at 20 mg; however, it had been increased to 40 mg.  "I don't want to deal with that doctor, you can just give me my papers to go."    Patient remained on the [facility] unit for 2 days. The patient stabilized on medication and therapy. Patient was discharged on Celexa 40 mg Daily, Vistaril 25 mg Q6H PRN, and Trazodone 50 mg QHS PRN. Patient has shown improvement with improved mood, affect, sleep, appetite, and interaction. Patient has attended group and participated. Patient has been seen in the day room interacting with peers and staff appropriately. Patient denies any SI/HI/AVH and contracts for safety. Patient agrees to follow up at [facility] per CSW. Patient is provided with prescriptions for their medications upon discharge.    Physical Findings: AIMS: Facial and Oral Movements Muscles of Facial Expression: None, normal Lips and Perioral Area: None, normal Jaw: None, normal Tongue: None, normal,Extremity Movements Upper (arms, wrists, hands, fingers): None, normal Lower (legs, knees, ankles, toes): None, normal, Trunk Movements Neck, shoulders, hips: None, normal, Overall Severity Severity of abnormal movements (highest score from questions above): None, normal Incapacitation due to abnormal movements: None, normal Patient's awareness of abnormal movements (rate only patient's report): No Awareness, Dental Status Current problems with teeth and/or dentures?: No Does patient usually wear dentures?: No  CIWA:    COWS:     Musculoskeletal: Strength & Muscle Tone: within normal limits Gait & Station: normal Patient leans: N/A  Psychiatric Specialty Exam: Physical Exam  Nursing note and vitals reviewed. Constitutional: He is oriented to person, place, and time. He appears well-developed and well-nourished.  Cardiovascular: Normal rate.  Respiratory: Effort normal.   Musculoskeletal: Normal range of motion.  Neurological: He is alert and oriented to person, place, and time.  Skin: Skin is warm.    Review of Systems  Constitutional: Negative.   HENT: Negative.   Eyes: Negative.   Respiratory: Negative.   Cardiovascular: Negative.   Gastrointestinal: Negative.   Genitourinary: Negative.   Musculoskeletal: Negative.   Skin: Negative.   Neurological: Negative.   Endo/Heme/Allergies: Negative.   Psychiatric/Behavioral: Negative.     Blood pressure 120/85, pulse 71, temperature 98.7 F (37.1 C), temperature source Oral, resp. rate 18, height 6' (1.829 m), weight 87.5 kg (193 lb).Body mass index is 26.18 kg/m.  General Appearance: Casual  Eye Contact:  Good  Speech:  Clear and Coherent and Normal Rate  Volume:  Normal  Mood:  Euthymic  Affect:  Congruent  Thought Process:  Goal Directed and Descriptions of Associations: Intact  Orientation:  Full (Time, Place, and Person)  Thought Content:  WDL  Suicidal  Thoughts:  No  Homicidal Thoughts:  No  Memory:  Immediate;   Good Recent;   Good Remote;   Good  Judgement:  Fair  Insight:  Good  Psychomotor Activity:  Normal  Concentration:  Concentration: Good and Attention Span: Good  Recall:  Good  Fund of Knowledge:  Good  Language:  Good  Akathisia:  No  Handed:  Right  AIMS (if indicated):     Assets:  Communication Skills Desire for Improvement Financial Resources/Insurance Housing Physical Health Social Support Transportation  ADL's:  Intact  Cognition:  WNL  Sleep:  Number of Hours: 4.25     Have you used any form of tobacco in the last 30 days? (Cigarettes, Smokeless Tobacco, Cigars, and/or Pipes): Yes  Has this patient used any form of tobacco in the last 30 days? (Cigarettes, Smokeless Tobacco, Cigars, and/or Pipes) Yes, Yes, A prescription for an FDA-approved tobacco cessation medication was offered at discharge and the patient refused  Blood Alcohol level:  Lab Results   Component Value Date   ETH <10 05/10/2017    Metabolic Disorder Labs:  Lab Results  Component Value Date   HGBA1C 5.4 05/11/2017   MPG 108.28 05/11/2017   No results found for: PROLACTIN No results found for: CHOL, TRIG, HDL, CHOLHDL, VLDL, LDLCALC  See Psychiatric Specialty Exam and Suicide Risk Assessment completed by Attending Physician prior to discharge.  Discharge destination:  Home  Is patient on multiple antipsychotic therapies at discharge:  No   Has Patient had three or more failed trials of antipsychotic monotherapy by history:  No  Recommended Plan for Multiple Antipsychotic Therapies: NA   Allergies as of 05/14/2017      Reactions   Lisinopril Swelling   Peanut-containing Drug Products Swelling      Medication List    STOP taking these medications   Albuterol Sulfate 108 (90 Base) MCG/ACT Aepb Replaced by:  albuterol 108 (90 Base) MCG/ACT inhaler   ENSURE   hydrocortisone cream 1 %   multivitamin capsule   naproxen 500 MG tablet Commonly known as:  NAPROSYN   nicotine 21 mg/24hr patch Commonly known as:  NICODERM CQ - dosed in mg/24 hours   pyridOXINE 100 MG tablet Commonly known as:  VITAMIN B-6   thiamine 100 MG tablet     TAKE these medications     Indication  albuterol 108 (90 Base) MCG/ACT inhaler Commonly known as:  PROVENTIL HFA;VENTOLIN HFA Inhale 2 puffs into the lungs every 4 (four) hours as needed (sob). Replaces:  Albuterol Sulfate 108 (90 Base) MCG/ACT Aepb  Indication:  Asthma   amLODipine 10 MG tablet Commonly known as:  NORVASC Take 1 tablet (10 mg total) by mouth daily. For high blood pressure What changed:  additional instructions  Indication:  High Blood Pressure Disorder   atorvastatin 20 MG tablet Commonly known as:  LIPITOR Take 1 tablet (20 mg total) by mouth daily at 6 PM.  Indication:  High Amount of Fats in the Blood   citalopram 40 MG tablet Commonly known as:  CELEXA Take 1 tablet (40 mg total) by  mouth daily. For mood control What changed:  additional instructions  Indication:  mood stability   famotidine 20 MG tablet Commonly known as:  PEPCID Take 1 tablet (20 mg total) by mouth 2 (two) times daily. For acid reflux What changed:  additional instructions  Indication:  Gastroesophageal Reflux Disease   hydrOXYzine 25 MG tablet Commonly known as:  ATARAX/VISTARIL Take 1 tablet (25  mg total) by mouth every 6 (six) hours as needed for anxiety.  Indication:  Feeling Anxious   metoprolol tartrate 50 MG tablet Commonly known as:  LOPRESSOR Take 1 tablet (50 mg total) by mouth 2 (two) times daily. For high blood pessure What changed:  additional instructions  Indication:  High Blood Pressure Disorder   tamsulosin 0.4 MG Caps capsule Commonly known as:  FLOMAX Take 1 capsule (0.4 mg total) by mouth daily after supper. What changed:  when to take this  Indication:  Benign Enlargement of Prostate   traZODone 50 MG tablet Commonly known as:  DESYREL Take 1 tablet (50 mg total) by mouth at bedtime as needed for sleep.  Indication:  Trouble Sleeping        Follow-up recommendations:  Continue activity as tolerated. Continue diet as recommended by your PCP. Ensure to keep all appointments with outpatient providers.  Comments:  Patient is instructed prior to discharge to: Take all medications as prescribed by his/her mental healthcare provider. Report any adverse effects and or reactions from the medicines to his/her outpatient provider promptly. Patient has been instructed & cautioned: To not engage in alcohol and or illegal drug use while on prescription medicines. In the event of worsening symptoms, patient is instructed to call the crisis hotline, 911 and or go to the nearest ED for appropriate evaluation and treatment of symptoms. To follow-up with his/her primary care provider for your other medical issues, concerns and or health care needs.    Signed: Gerlene Burdock Yishai Rehfeld,  FNP 05/14/2017, 8:31 AM

## 2017-05-14 NOTE — Progress Notes (Signed)
Patient ID: Jonathan Martin, male   DOB: [birth year], 62 y.o.   MRN: [id number]  Discharge Note  D) Patient discharged to lobby on 05/14/2017 2:50 PM. Patient states readiness for discharge. Patient denies SI/HI, AVH and is not delusional or psychotic. Patient in no acute distress. Patient has completed their Suicide Safety Plan. Patient provided an opportunity to complete and return Patient Satisfaction Survey. Patient informed Risk Management will follow up with patient about lost belongings, patient verbalizes understanding.   A) Written and verbal discharge instructions given to the patient. Patient accepting to information and verbalized understanding. Patient agrees to the discharge plan. Opportunity for questions and concerns presented to patient. Patient denied any further questions or concerns. All belongings returned to patient. Patient signed for return of belongings and discharge paperwork. Patient provided a copy of their Suicide Safety Plan and has been provided a copy of the Patient Satisfaction Survey with return instructions. Knife from locker #20 returned to patient.  R) Patient safely escorted to the lobby. Patient discharged from [facility] with prescriptions, personal belongings, local and long distant bus passes, follow-up appointment in place and discharge paperwork.

## 2017-05-14 NOTE — Progress Notes (Signed)
Patient did attend the evening speaker NA meeting.  

## 2017-05-14 NOTE — Progress Notes (Signed)
Patient requested that the CSW provide his demographic face sheet and social security number as proof of identity due to the patient's belongings being lost while he was transported to [facility] from [facility] on 05/11/17.   The patient has given verbal permission for his information to be included in his discharge paperwork.     Jonathan Martin, MSW, LCSWA Clinical Social Worker [facility]  Phone: [phone]

## 2017-05-14 NOTE — [facility] Group Notes (Signed)
Pt was invited but did not attend orientation/goals group. 

## 2017-05-14 NOTE — Progress Notes (Signed)
Patient ID: Jonathan Martin, male   DOB: 07/22/1955, 62 y.o.   MRN: [id number]  Nursing Progress Note [id number]  Data: Patient presents with irritable mood and is minimal with writer this morning. Patient complaint with scheduled medications. Patient denies SI/HI/AVH. Patient contracts for safety on the unit at this time. Patient reports chronic pain of his L hip and is provided scheduled Naproxen. Patient completed self-inventory sheet and rates depression, hopelessness, and anxiety 10,10,9 respectively. Patient rates their sleep and appetite as fair/fair respectively. Patient states goal for today is to "get my belongings back from the ED".   Action: Patient educated about and provided medication per provider's orders. Patient safety maintained with q15 min safety checks. Moderate fall risk precautions in place. Emotional support given. 1:1 interaction and active listening provided. Patient encouraged to attend meals and groups. Labs, vital signs and patient behavior monitored throughout shift. Patient encouraged to work on treatment plan and goals.  Response: Patient remains safe on the unit at this time. Will continue to support and monitor.

## 2017-05-14 NOTE — Progress Notes (Signed)
  [facility] Adult Case Management Discharge Plan :  Will you be returning to the same living situation after discharge:  No. Patient is discharging to the [facility].  At discharge, do you have transportation home?: Yes,  bus passes Do you have the ability to pay for your medications: No.  Release of information consent forms completed and in the chart;  Patient's signature needed at discharge.  Patient to Follow up at: Follow-up Information    [facility] Follow up.   Specialty:  Behavioral Health Why:  Walk-in hours are Monday-Friday, 8:00am-4:30pm for medication management and therapy services Contact information: [address] [place] [address] [phone]       [facility]. Go to.   Contact information: [address] [place], [place] [address]  Telephone: [phone] Fax: [phone]          Next level of care provider has access to [facility] Link:yes  Safety Planning and Suicide Prevention discussed: Yes,  with the patient  Have you used any form of tobacco in the last 30 days? (Cigarettes, Smokeless Tobacco, Cigars, and/or Pipes): Yes  Has patient been referred to the Quitline?: Patient refused referral  Patient has been referred for addiction treatment: Yes  Maeola Sarah, LCSWA 05/14/2017, 2:22 PM

## 2017-05-30 ENCOUNTER — Encounter (HOSPITAL_COMMUNITY): Payer: Self-pay

## 2017-05-30 ENCOUNTER — Emergency Department (HOSPITAL_COMMUNITY)
Admission: EM | Admit: 2017-05-30 | Discharge: 2017-06-01 | Disposition: A | Discharge status: Home / Self Care | Payer: Medicare Other | Attending: Emergency Medicine | Admitting: Emergency Medicine

## 2017-05-30 ENCOUNTER — Other Ambulatory Visit: Payer: Self-pay

## 2017-05-30 DIAGNOSIS — R45851 Suicidal ideations: Secondary | ICD-10-CM | POA: Diagnosis not present

## 2017-05-30 DIAGNOSIS — Z79899 Other long term (current) drug therapy: Secondary | ICD-10-CM | POA: Insufficient documentation

## 2017-05-30 DIAGNOSIS — F332 Major depressive disorder, recurrent severe without psychotic features: Secondary | ICD-10-CM | POA: Diagnosis present

## 2017-05-30 DIAGNOSIS — F329 Major depressive disorder, single episode, unspecified: Secondary | ICD-10-CM | POA: Insufficient documentation

## 2017-05-30 DIAGNOSIS — I10 Essential (primary) hypertension: Secondary | ICD-10-CM | POA: Insufficient documentation

## 2017-05-30 DIAGNOSIS — Z91010 Allergy to peanuts: Secondary | ICD-10-CM | POA: Insufficient documentation

## 2017-05-30 DIAGNOSIS — F1994 Other psychoactive substance use, unspecified with psychoactive substance-induced mood disorder: Secondary | ICD-10-CM | POA: Diagnosis present

## 2017-05-30 DIAGNOSIS — F1910 Other psychoactive substance abuse, uncomplicated: Secondary | ICD-10-CM | POA: Diagnosis present

## 2017-05-30 DIAGNOSIS — F17200 Nicotine dependence, unspecified, uncomplicated: Secondary | ICD-10-CM | POA: Insufficient documentation

## 2017-05-30 HISTORY — DX: Cocaine abuse, uncomplicated: F14.10

## 2017-05-30 LAB — COMPREHENSIVE METABOLIC PANEL
ALBUMIN: 3.4 g/dL — AB (ref 3.5–5.0)
ALT: 21 U/L (ref 17–63)
AST: 37 U/L (ref 15–41)
Alkaline Phosphatase: 82 U/L (ref 38–126)
Anion gap: 9 (ref 5–15)
CHLORIDE: 103 mmol/L (ref 101–111)
CO2: 27 mmol/L (ref 22–32)
Calcium: 8.7 mg/dL — ABNORMAL LOW (ref 8.9–10.3)
Creatinine, Ser: 0.85 mg/dL (ref 0.61–1.24)
GFR calc Af Amer: >60 mL/min (ref >60)
GFR calc non Af Amer: >60 mL/min (ref >60)
GLUCOSE: 92 mg/dL (ref 65–99)
POTASSIUM: 3.2 mmol/L — AB (ref 3.5–5.1)
SODIUM: 139 mmol/L (ref 135–145)
TOTAL PROTEIN: 7.2 g/dL (ref 6.5–8.1)
Total Bilirubin: 0.7 mg/dL (ref 0.3–1.2)

## 2017-05-30 LAB — CBC
HEMATOCRIT: 34.2 % — AB (ref 39.0–52.0)
Hemoglobin: 11.1 g/dL — ABNORMAL LOW (ref 13.0–17.0)
MCH: 28.9 pg (ref 26.0–34.0)
MCHC: 32.5 g/dL (ref 30.0–36.0)
MCV: 89.1 fL (ref 78.0–100.0)
Platelets: 348 10*3/uL (ref 150–400)
RBC: 3.84 MIL/uL — ABNORMAL LOW (ref 4.22–5.81)
RDW: 15.7 % — AB (ref 11.5–15.5)
WBC: 6.7 10*3/uL (ref 4.0–10.5)

## 2017-05-30 LAB — RAPID URINE DRUG SCREEN, HOSP PERFORMED
Amphetamines: NOT DETECTED
BARBITURATES: NOT DETECTED
Benzodiazepines: NOT DETECTED
Cocaine: POSITIVE — AB
Opiates: NOT DETECTED
Tetrahydrocannabinol: POSITIVE — AB

## 2017-05-30 LAB — SALICYLATE LEVEL: Salicylate Lvl: <7 mg/dL (ref 2.8–30.0)

## 2017-05-30 LAB — ACETAMINOPHEN LEVEL: Acetaminophen (Tylenol), Serum: <10 ug/mL — ABNORMAL LOW (ref 10–30)

## 2017-05-30 LAB — I-STAT TROPONIN, ED: TROPONIN I, POC: 0 ng/mL (ref 0.00–0.08)

## 2017-05-30 LAB — ETHANOL

## 2017-05-30 MED ORDER — ATORVASTATIN CALCIUM 20 MG PO TABS
20.0000 mg | ORAL_TABLET | Freq: Every day | ORAL | Status: DC
Start: 2017-05-30 — End: 2017-06-01
  Administered 2017-05-30 – 2017-05-31 (×2): 20 mg via ORAL
  Filled 2017-05-30: qty 2
  Filled 2017-05-30 (×3): qty 1
  Filled 2017-05-30: qty 2

## 2017-05-30 MED ORDER — NICOTINE 21 MG/24HR TD PT24
21.0000 mg | MEDICATED_PATCH | Freq: Every day | TRANSDERMAL | Status: DC
  Administered 2017-05-30 – 2017-06-01 (×3): 21 mg via TRANSDERMAL
  Filled 2017-05-30 (×3): qty 1

## 2017-05-30 MED ORDER — AMLODIPINE BESYLATE 5 MG PO TABS
10.0000 mg | ORAL_TABLET | Freq: Once | ORAL | Status: AC
  Administered 2017-05-30: 10 mg via ORAL
  Filled 2017-05-30: qty 2

## 2017-05-30 MED ORDER — IBUPROFEN 400 MG PO TABS
600.0000 mg | ORAL_TABLET | Freq: Once | ORAL | Status: AC
  Administered 2017-05-30: 600 mg via ORAL
  Filled 2017-05-30: qty 1

## 2017-05-30 MED ORDER — AMLODIPINE BESYLATE 5 MG PO TABS: 5.0000 mg | ORAL_TABLET | Freq: Once | ORAL | Status: DC

## 2017-05-30 MED ORDER — CITALOPRAM HYDROBROMIDE 10 MG PO TABS
40.0000 mg | ORAL_TABLET | Freq: Every day | ORAL | Status: DC
  Administered 2017-05-30 – 2017-06-01 (×3): 40 mg via ORAL
  Filled 2017-05-30 (×3): qty 4

## 2017-05-30 MED ORDER — POTASSIUM CHLORIDE CRYS ER 20 MEQ PO TBCR
40.0000 meq | EXTENDED_RELEASE_TABLET | Freq: Once | ORAL | Status: AC
  Administered 2017-05-30: 40 meq via ORAL
  Filled 2017-05-30: qty 2

## 2017-05-30 MED ORDER — AMLODIPINE BESYLATE 5 MG PO TABS
10.0000 mg | ORAL_TABLET | Freq: Every day | ORAL | Status: DC
  Administered 2017-05-30 – 2017-06-01 (×3): 10 mg via ORAL
  Filled 2017-05-30 (×3): qty 2

## 2017-05-30 NOTE — ED Triage Notes (Signed)
Pt presents POV for suicidal ideation since this morning. Pt endorses using crack cocaine through night. States has been off of his medication for BP and depression x 3 weeks. Pt reports thoughts of cutting jugular.

## 2017-05-30 NOTE — ED Notes (Signed)
Pt woke from nap - ambulated to bathroom and back to room. Aware dinner will be served soon.

## 2017-05-30 NOTE — ED Notes (Addendum)
Pt ambulatory to F8 - wearing burgundy scrubs and tennis shoes w/laces. Pt ambulated to bathroom and back to room - gave shoes to RN as requested. Pt given non-slip socks. Pt's belongings - 1 labeled belongings bag, 1 black backpack, and 1 green suitcase place at nurses' desk for inventory. States ETOH and cocaine "messed me up". States was w/some friends last night. Pt verbalized understanding of Medical Clearance Pt Policy form - signed - copy given to pt. Lunch ordered for pt. Pt given soda as requested.

## 2017-05-30 NOTE — BH Assessment (Signed)
[facility] Assessment Progress Note      Dr. Lynelle Doctor notified of disposition:  Inpatient Treatment is recommended
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
## 2017-05-30 NOTE — ED Notes (Signed)
TTS being performed.  

## 2017-05-30 NOTE — ED Notes (Signed)
[facility] aware of need for TTS.

## 2017-05-30 NOTE — ED Provider Notes (Signed)
[facility] EMERGENCY DEPARTMENT Provider Note   CSN: [id number] Arrival date & time: 05/30/17  1038     History   Chief Complaint Chief Complaint  Patient presents with  . Suicidal    HPI Juston Goheen is a 62 y.o. male history of hypertension, depression here presenting with suicidal ideation, drug overdose.  Patient states that he has some friends that came in last night and he did cocaine all night.  He also drank a lot of alcohol as well.  He states that this morning he feels really depressed and wants to kill himself.  He plans to cut his throat and "stab his jugular vein".  He denies any hallucinations or homicidal ideations.  He was recently admitted to the psychiatric hospital.  He also has a history of hypertension but is not taking his blood pressure medicines.  Nuys any chest pain currently.  The history is provided by the patient.    Past Medical History:  Diagnosis Date  . Depression   . Hypertension     Patient Active Problem List   Diagnosis Date Noted  . Major depressive disorder, recurrent episode, severe (HCC) 05/11/2017  . Polysubstance abuse (HCC) 05/11/2017  . Suicidal ideations   . Alcoholic polyneuropathy (HCC)   . Chest pain 05/10/2017    History reviewed. No pertinent surgical history.      Home Medications    Prior to Admission medications   Medication Sig Start Date End Date Taking? Authorizing Provider  albuterol (PROVENTIL HFA;VENTOLIN HFA) 108 (90 Base) MCG/ACT inhaler Inhale 2 puffs into the lungs every 4 (four) hours as needed (sob). Patient taking differently: Inhale 2 puffs into the lungs every 4 (four) hours as needed for shortness of breath.  05/14/17  Yes Money, Gerlene Burdock, FNP  amLODipine (NORVASC) 10 MG tablet Take 1 tablet (10 mg total) by mouth daily. For high blood pressure 05/14/17  Yes Money, Gerlene Burdock, FNP  atorvastatin (LIPITOR) 20 MG tablet Take 1 tablet (20 mg total) by mouth daily at 6 PM. 05/14/17  Yes Money,  Gerlene Burdock, FNP  citalopram (CELEXA) 40 MG tablet Take 1 tablet (40 mg total) by mouth daily. For mood control 05/14/17  Yes Money, Gerlene Burdock, FNP  metoprolol tartrate (LOPRESSOR) 50 MG tablet Take 1 tablet (50 mg total) by mouth 2 (two) times daily. For high blood pessure 05/14/17  Yes Money, Gerlene Burdock, FNP  tamsulosin (FLOMAX) 0.4 MG CAPS capsule Take 1 capsule (0.4 mg total) by mouth daily after supper. 05/14/17  Yes Money, Gerlene Burdock, FNP  famotidine (PEPCID) 20 MG tablet Take 1 tablet (20 mg total) by mouth 2 (two) times daily. For acid reflux Patient not taking: Reported on 05/30/2017 05/14/17   Money, Gerlene Burdock, FNP  hydrOXYzine (ATARAX/VISTARIL) 25 MG tablet Take 1 tablet (25 mg total) by mouth every 6 (six) hours as needed for anxiety. Patient not taking: Reported on 05/30/2017 05/14/17   Money, Gerlene Burdock, FNP  traZODone (DESYREL) 50 MG tablet Take 1 tablet (50 mg total) by mouth at bedtime as needed for sleep. Patient not taking: Reported on 05/30/2017 05/14/17   Money, Gerlene Burdock, FNP    Family History No family history on file.  Social History Social History   Tobacco Use  . Smoking status: Current Every Day Smoker    Packs/day: 1.00  . Smokeless tobacco: Current User  Substance Use Topics  . Alcohol use: Yes    Alcohol/week: 10.8 oz    Types: 12 Cans  of beer, 6 Glasses of wine per week    Comment: drinks daily  . Drug use: Yes    Frequency: 7.0 times per week    Types: Cocaine, Marijuana    Comment: uses daily on binges, whole paycheck     Allergies   Lisinopril and Peanut-containing drug products   Review of Systems Review of Systems  Psychiatric/Behavioral: Positive for suicidal ideas.  All other systems reviewed and are negative.    Physical Exam Updated Vital Signs BP (!) 161/103 (BP Location: Right Arm)   Pulse 87   Temp 98.8 F (37.1 C) (Oral)   Resp 16   SpO2 97%   Physical Exam  Constitutional:  Depressed, tired   HENT:  Head: Normocephalic.  Mouth/Throat:  Oropharynx is clear and moist.  Eyes: Pupils are equal, round, and reactive to light. Conjunctivae and EOM are normal.  Neck: Normal range of motion. Neck supple.  Cardiovascular: Normal rate, regular rhythm and normal heart sounds.  Pulmonary/Chest: Effort normal and breath sounds normal. No stridor. No respiratory distress.  Abdominal: Soft. Bowel sounds are normal.  Musculoskeletal: Normal range of motion.  Neurological: He is alert.  Tired, arousable. Moving all extremities   Skin: Skin is warm.  Psychiatric: He has a normal mood and affect.  Nursing note and vitals reviewed.    ED Treatments / Results  Labs (all labs ordered are listed, but only abnormal results are displayed) Labs Reviewed  COMPREHENSIVE METABOLIC PANEL - Abnormal; Notable for the following components:      Result Value   Potassium 3.2 (*)    BUN <5 (*)    Calcium 8.7 (*)    Albumin 3.4 (*)    All other components within normal limits  ACETAMINOPHEN LEVEL - Abnormal; Notable for the following components:   Acetaminophen (Tylenol), Serum <10 (*)    All other components within normal limits  CBC - Abnormal; Notable for the following components:   RBC 3.84 (*)    Hemoglobin 11.1 (*)    HCT 34.2 (*)    RDW 15.7 (*)    All other components within normal limits  RAPID URINE DRUG SCREEN, HOSP PERFORMED - Abnormal; Notable for the following components:   Cocaine POSITIVE (*)    Tetrahydrocannabinol POSITIVE (*)    All other components within normal limits  ETHANOL  SALICYLATE LEVEL  I-STAT TROPONIN, ED    EKG EKG Interpretation  Date/Time:  Saturday May 30 2017 12:33:21 EDT Ventricular Rate:  61 PR Interval:  192 QRS Duration: 98 QT Interval:  442 QTC Calculation: 444 R Axis:   67 Text Interpretation:  Normal sinus rhythm Minimal voltage criteria for LVH, may be normal variant Borderline ECG No significant change since last tracing Confirmed by Richardean Canal [phone]) on 05/30/2017 1:38:56  PM   Radiology No results found.  Procedures Procedures (including critical care time)  Medications Ordered in ED Medications  amLODipine (NORVASC) tablet 10 mg (10 mg Oral Given 05/30/17 1204)     Initial Impression / Assessment and Plan / ED Course  I have reviewed the triage vital signs and the nursing notes.  Pertinent labs & imaging results that were available during my care of the patient were reviewed by me and considered in my medical decision making (see chart for details).    Babacar Haycraft is a 62 y.o. male here with suicidal ideation, drug overdose. Drank a lot of alcohol last night. Also did cocaine and has suicidal ideation and plans to cut  his throat. Will get psych clearance labs, consult TTS.   1:39 PM Patient's labs unremarkable. Medically cleared for psych evaluation. Psych holding orders placed.    Final Clinical Impressions(s) / ED Diagnoses   Final diagnoses:  None    ED Discharge Orders    None       Charlynne Pander, MD 05/30/17 1340

## 2017-05-31 MED ORDER — MAGNESIUM HYDROXIDE 400 MG/5ML PO SUSP
30.0000 mL | Freq: Every day | ORAL | Status: DC | PRN
  Administered 2017-05-31: 30 mL via ORAL
  Filled 2017-05-31: qty 30

## 2017-05-31 MED ORDER — ACETAMINOPHEN 325 MG PO TABS
650.0000 mg | ORAL_TABLET | ORAL | Status: DC | PRN
  Administered 2017-05-31 – 2017-06-01 (×2): 650 mg via ORAL
  Filled 2017-05-31 (×2): qty 2

## 2017-05-31 MED ORDER — NAPHAZOLINE-GLYCERIN 0.012-0.2 % OP SOLN
1.0000 [drp] | Freq: Four times a day (QID) | OPHTHALMIC | Status: DC | PRN
  Administered 2017-05-31: 2 [drp] via OPHTHALMIC
  Filled 2017-05-31: qty 15

## 2017-05-31 MED ORDER — MAGNESIUM HYDROXIDE 400 MG/5ML PO SUSP
30.0000 mL | Freq: Once | ORAL | Status: AC
  Administered 2017-05-31: 30 mL via ORAL
  Filled 2017-05-31: qty 30

## 2017-05-31 MED ORDER — IBUPROFEN 200 MG PO TABS
600.0000 mg | ORAL_TABLET | Freq: Three times a day (TID) | ORAL | Status: DC | PRN
Start: 2017-05-31 — End: 2017-06-01
  Administered 2017-05-31 – 2017-06-01 (×3): 600 mg via ORAL
  Filled 2017-05-31 (×3): qty 1

## 2017-05-31 NOTE — ED Notes (Addendum)
Pt c/o upper left toothache. Requesting ibuprofen for pain. Spoke with Sharilyn Sites, PA. Able to give pt ibuprofen and also ask about "numbing swabs" for gum area. PA states are able to give a few swabs for bedside use.

## 2017-05-31 NOTE — ED Notes (Signed)
Pt noted to be standing at nurses' desk asking "Can I get my meds now?" Asked pt to return to his room and RN will bring meds. Pt lying on bed - noted to be irritable/agitated. States he does not want male sitter watching him. Pt calling Sitter inappropriate names - "Why does that fagot need to be looking at me?!" Encouraged pt display appropriate behavior.

## 2017-05-31 NOTE — ED Notes (Signed)
Pt in shower.  

## 2017-05-31 NOTE — ED Notes (Signed)
Staff opened door to patients room patient yelled to leave the door like he had it(closed).  Staff advised patient that we had to leave the door open at all times.  Patient then yelled; how about you "suck my dic*".  Staff advised patient that this was very disrespectful, patient did not respond further.

## 2017-05-31 NOTE — ED Notes (Signed)
Pt noted to be lying on bed w/eyes closed. Awakened easily - dinner order request received. Offered pt to eat snack now as he may be asleep at 1500 snack time - declined.

## 2017-05-31 NOTE — ED Notes (Signed)
Snack and drink provided to pt. Cooperative and pleasant.

## 2017-05-31 NOTE — ED Notes (Signed)
Pt called detox facility from phone at nurses' desk.

## 2017-05-31 NOTE — ED Notes (Signed)
Pt noted w/reading eyeglasses on bedside table - black rimmed.

## 2017-05-31 NOTE — ED Notes (Signed)
Pt noted to be talking to Sitter.  

## 2017-05-31 NOTE — ED Notes (Signed)
Pt requesting Visine, Milk of Magnesia, and Ibuprofen - Dr Corlis Leak aware - order received.

## 2017-05-31 NOTE — ED Notes (Addendum)
Pt talking w/male Sitter.

## 2017-05-31 NOTE — ED Notes (Signed)
Lying on bed watching tv and talking w/Sitter intermittently.

## 2017-05-31 NOTE — ED Notes (Signed)
Pt given Sprite as requested for snack. States did not want any food at this time. Pt given MOM as requested d/t states last BM was x 3 days ago.

## 2017-05-31 NOTE — ED Notes (Signed)
States did not sleep well d/t states he has prostate problems and has to go to the bathroom often. States "I slept about 5 hours".

## 2017-05-31 NOTE — ED Notes (Signed)
Breakfast tray ordered 

## 2017-05-31 NOTE — ED Notes (Signed)
Spoke with Dr. Corlis Leak about tylenol prn, milk of magnesia, and pt requesting flomax and metoprolol. OK to order tylenol and MOM, MD wants to hold the flomax and Metoprolol as of this time.

## 2017-05-31 NOTE — ED Notes (Signed)
Re-TTS being performed.  

## 2017-05-31 NOTE — Progress Notes (Signed)
Patient meets criteria for inpatient treatment. There are currently no appropriate beds available at [facility] per [facility]. CSW faxed referrals to the following facilities for review.  [facility], [facility], [facility], [place], [address], [place], [place], [place], and [place].   TTS will continue to seek bed placement.     Moss Mc, MSW, LCSW-A, LCAS-A 05/31/2017 9:57 AM

## 2017-05-31 NOTE — [facility] Counselor (Signed)
Pt was reassessed this AM.  Per report, Pt has become increasingly irritable and verbally abusive this AM toward hospital staff.  Pt acknowledged that he is becoming more angry, stating that he is in pain, that he is nervous that the hospital will misplace his property (including medication).    Pt endorsed continued suicidal ideation (currently without plan) and continued substance use.  He stated that the night before he came to the hospital, he used crack cocaine all day.  Pt endorsed despondency, hopelessness, and fatigue, as well as the psychosocial stressor of homelessness.   Pt continues to meet inpatient criteria.  62 y.o.malewho presented at [facility] with complaints of chest pain. Patient states that he has been drinking and using cocaine almost daily. Patient states that his wife left him two years ago and that his life has gone to [place]. Patient states that he is homeless, has minimal support. He states that he has been drinking and using to self-medicate his depression and things got so bad that last night he had thoughts about cutting his jugler vein and laying on the floor and bleeding out. Patient states that he does not care if he lives or dies. He states that he has tried to kill himself on a couple occasions in the past. On one occasion he states that he fell off a building and did not care if it killed him. He states that he fractured several bones. Patient states that he has been hospitalized on a couple occasions at the [facility] and states that his last hospitalization was 1.5 years ago. Patient denies HI and Psychosis.

## 2017-06-01 ENCOUNTER — Emergency Department (HOSPITAL_COMMUNITY): Payer: Medicare Other

## 2017-06-01 ENCOUNTER — Encounter (HOSPITAL_COMMUNITY): Payer: Self-pay | Admitting: *Deleted

## 2017-06-01 ENCOUNTER — Other Ambulatory Visit: Payer: Self-pay

## 2017-06-01 ENCOUNTER — Emergency Department (HOSPITAL_COMMUNITY)
Admission: EM | Admit: 2017-06-01 | Discharge: 2017-06-02 | Disposition: A | Discharge status: Home / Self Care | Payer: Medicare Other | Source: Home / Self Care | Attending: Emergency Medicine | Admitting: Emergency Medicine

## 2017-06-01 DIAGNOSIS — R0789 Other chest pain: Secondary | ICD-10-CM

## 2017-06-01 DIAGNOSIS — F332 Major depressive disorder, recurrent severe without psychotic features: Secondary | ICD-10-CM

## 2017-06-01 DIAGNOSIS — I10 Essential (primary) hypertension: Secondary | ICD-10-CM

## 2017-06-01 DIAGNOSIS — F17210 Nicotine dependence, cigarettes, uncomplicated: Secondary | ICD-10-CM

## 2017-06-01 DIAGNOSIS — Z818 Family history of other mental and behavioral disorders: Secondary | ICD-10-CM

## 2017-06-01 DIAGNOSIS — Z91010 Allergy to peanuts: Secondary | ICD-10-CM

## 2017-06-01 DIAGNOSIS — Z79899 Other long term (current) drug therapy: Secondary | ICD-10-CM | POA: Insufficient documentation

## 2017-06-01 DIAGNOSIS — R0602 Shortness of breath: Secondary | ICD-10-CM

## 2017-06-01 DIAGNOSIS — G4700 Insomnia, unspecified: Secondary | ICD-10-CM

## 2017-06-01 DIAGNOSIS — R079 Chest pain, unspecified: Secondary | ICD-10-CM

## 2017-06-01 DIAGNOSIS — F419 Anxiety disorder, unspecified: Secondary | ICD-10-CM

## 2017-06-01 DIAGNOSIS — F1994 Other psychoactive substance use, unspecified with psychoactive substance-induced mood disorder: Secondary | ICD-10-CM

## 2017-06-01 LAB — CBC
HCT: 34.9 % — ABNORMAL LOW (ref 39.0–52.0)
HEMOGLOBIN: 11.3 g/dL — AB (ref 13.0–17.0)
MCH: 29.2 pg (ref 26.0–34.0)
MCHC: 32.4 g/dL (ref 30.0–36.0)
MCV: 90.2 fL (ref 78.0–100.0)
Platelets: 396 10*3/uL (ref 150–400)
RBC: 3.87 MIL/uL — ABNORMAL LOW (ref 4.22–5.81)
RDW: 15.2 % (ref 11.5–15.5)
WBC: 7.3 10*3/uL (ref 4.0–10.5)

## 2017-06-01 LAB — I-STAT TROPONIN, ED
TROPONIN I, POC: 0.01 ng/mL (ref 0.00–0.08)
TROPONIN I, POC: 0 ng/mL (ref 0.00–0.08)

## 2017-06-01 LAB — BASIC METABOLIC PANEL
ANION GAP: 11 (ref 5–15)
BUN: 6 mg/dL (ref 6–20)
CALCIUM: 9.3 mg/dL (ref 8.9–10.3)
CO2: 24 mmol/L (ref 22–32)
Chloride: 100 mmol/L — ABNORMAL LOW (ref 101–111)
Creatinine, Ser: 1.09 mg/dL (ref 0.61–1.24)
GFR calc Af Amer: >60 mL/min (ref >60)
GFR calc non Af Amer: >60 mL/min (ref >60)
GLUCOSE: 153 mg/dL — AB (ref 65–99)
Potassium: 3.3 mmol/L — ABNORMAL LOW (ref 3.5–5.1)
SODIUM: 135 mmol/L (ref 135–145)

## 2017-06-01 MED ORDER — IOPAMIDOL (ISOVUE-370) INJECTION 76%
INTRAVENOUS | Status: AC
  Filled 2017-06-01: qty 100

## 2017-06-01 MED ORDER — TAMSULOSIN HCL 0.4 MG PO CAPS
0.4000 mg | ORAL_CAPSULE | Freq: Every day | ORAL | Status: DC
  Administered 2017-06-01: 0.4 mg via ORAL
  Filled 2017-06-01: qty 1

## 2017-06-01 MED ORDER — IOPAMIDOL (ISOVUE-370) INJECTION 76%
100.0000 mL | Freq: Once | INTRAVENOUS | Status: AC | PRN
  Administered 2017-06-01: 100 mL via INTRAVENOUS

## 2017-06-01 NOTE — ED Provider Notes (Signed)
Patient seen and evaluated.  Discussed with Dr. Duke Salvia.  Patient reports chest pain.  CT does not show dissection.  EKG without changes.  Initial troponin negative.  Awaiting delta.  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  However he is not retaining urine, and his renal function is normal.  No acute findings noted.  Seen and discharged less than 8 hours ago.  Had psychiatric evaluation at that time.  Has history of cocaine abuse and intermittent suicidal ideations.  Was cleared for discharge at that time.  However physician note states patient is homeless and was felt to be interested in a place to stay.  If delta troponin is negative.  No indications for admission.   Rolland Porter, MD 06/01/17 2326

## 2017-06-01 NOTE — Progress Notes (Signed)
Referral has been followed up at the following inpatient treatment facilities: [facility], [facility], and [facility].    Declined at: [facility] - per [place], due to chronicity [facility] - per Belenda Cruise, due to alcohol withdrawal.  [facility] - per Carollee Herter, refax. Referral refaxed. [facility] - patient is under review. [facility] - per [place], they did not receive the referral. Refax. Referral refaxed. [facility] - per Sheria Lang, patient is being reviewed now.   CSW in disposition will continuee to follow up for disposition updates and placement options.  Melbourne Abts, MSW, LCSWA Clinical social worker in disposition [facility], TTS Office [phone] and [phone] 06/01/2017 1:47 PM

## 2017-06-01 NOTE — ED Notes (Signed)
Breakfast tray ordered 

## 2017-06-01 NOTE — Consult Note (Signed)
Telepsych Consultation   Reason for Consult:  Transient thoughts of self harm yesterday Referring Physician:  EDP Location of Patient:  Location of Provider: [facility]  Patient Identification: Jonathan Martin MRN:  [id number] Principal Diagnosis: Substance induced mood disorder ([place]) Diagnosis:   Patient Active Problem List   Diagnosis Date Noted  . Major depressive disorder, recurrent episode, severe ([place]) [F33.2] 05/11/2017  . Polysubstance abuse ([place]) [F19.10] 05/11/2017  . Suicidal ideations [R45.851]   . 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 Chest pain [R07.9] 05/10/2017    Total Time spent with patient: 30 minutes  Subjective:   Jonathan Martin is a 62 y.o. male patient admitted with reports of transient suicidal ideation without plan/intent secondary to substance abuse and homelessness. Pt seen and chart reviewed. Pt is alert/oriented x4, calm, cooperative, and appropriate to situation. Pt denies suicidal/homicidal ideation and psychosis and does not appear to be responding to internal stimuli. Pt states that he has no intention or plan to harm himself yet that he is very frustrated with his life at this point in time and would like to enter a rehabilitation program. Pt reports that he would like to stop smoking crack/cocaine on a daily basis. Pt receptive to the idea of [facility].   HPI:  I have reviewed and concur with HPI elements below, modified as follows:  "62 y.o.malewho presented at [facility] with complaints of chest pain. Patient states that he has been drinking and using cocaine almost daily. Patient states that his wife left him two years ago and that his life has gone to [place]. Patient states that he is homeless, has minimal support. He states that he has been drinking and using to self-medicate his depression and things got so bad that last night he had thoughts about cutting his jugler vein and laying on the floor and bleeding out. Patient states that he  does not care if he lives or dies. He states that he has tried to kill himself on a couple occasions in the past. On one occasion he states that he fell off a building and did not care if it killed him. He states that he fractured several bones. Patient states that he has been hospitalized on a couple occasions at the [facility] and states that his last hospitalization was 1.5 years ago."  Pt has been calm and cooperative in the ED without any signs of attempted self-harm or aggression toward others. Pt has been cooperative with staff and they deny any new concerns at this time.     Past Psychiatric History: ETOH abuse, cocaine abuse, depression  Risk to Self: Suicidal Ideation: Yes-Currently Present Suicidal Intent: Yes-Currently Present Is patient at risk for suicide?: Yes Suicidal Plan?: Yes-Currently Present Specify Current Suicidal Plan: (same plan last 2 visits, to cut juglar vein) Access to Means: Yes Specify Access to Suicidal Means: (states that he has access to a knife) What has been your use of drugs/alcohol within the last 12 months?: (daily cocaine, alcohol, THC) How many times?: 2 Other Self Harm Risks: (homeless and recent divorce) Triggers for Past Attempts: Spouse contact(pt states his life has gone down hill since his divorce) Intentional Self Injurious Behavior: None Risk to Others: Homicidal Ideation: No Thoughts of Harm to Others: No Current Homicidal Intent: No Current Homicidal Plan: No Access to Homicidal Means: No Identified Victim: none History of harm to others?: No Assessment of Violence: None Noted Violent Behavior Description: (none) Does patient have access to weapons?: Yes (Comment)(states that he  has bought a new knife) Criminal Charges Pending?: No Does patient have a court date: No Prior Inpatient Therapy: Prior Inpatient Therapy: Yes Prior Therapy Dates: (1.5 years ago) Prior Therapy Facilty/Provider(s): [facility]) Reason for Treatment:  (depression and SA Use) Prior Outpatient Therapy: Prior Outpatient Therapy: No Does patient have an ACCT team?: No Does patient have Intensive In-House Services?  : No Does patient have [facility] services? : No Does patient have P4CC services?: No  Past Medical History:  Past Medical History:  Diagnosis Date  . Cocaine abuse ([place])   . Depression   . Hypertension    History reviewed. No pertinent surgical history. Family History: No family history on file. Family Psychiatric  History: depression Social History:  Social History   Substance and Sexual Activity  Alcohol Use Yes  . Alcohol/week: 10.8 oz  . Types: 6 Glasses of wine, 12 Cans of beer per week   Comment: drinks daily     Social History   Substance and Sexual Activity  Drug Use Yes  . Frequency: 7.0 times per week  . Types: Cocaine, Marijuana   Comment: uses daily on binges, whole paycheck    Social History   Socioeconomic History  . Marital status: Divorced    Spouse name: Not on file  . Number of children: Not on file  . Years of education: Not on file  . Highest education level: Not on file  Occupational History  . Not on file  Social Needs  . Financial resource strain: Not on file  . Food insecurity:    Worry: Not on file    Inability: Not on file  . Transportation needs:    Medical: Not on file    Non-medical: Not on file  Tobacco Use  . Smoking status: Current Every Day Smoker    Packs/day: 1.00  . Smokeless tobacco: Current User  Substance and Sexual Activity  . Alcohol use: Yes    Alcohol/week: 10.8 oz    Types: 6 Glasses of wine, 12 Cans of beer per week    Comment: drinks daily  . Drug use: Yes    Frequency: 7.0 times per week    Types: Cocaine, Marijuana    Comment: uses daily on binges, whole paycheck  . Sexual activity: Yes  Lifestyle  . Physical activity:    Days per week: Not on file    Minutes per session: Not on file  . Stress: Not on file  Relationships  . Social  connections:    Talks on phone: Not on file    Gets together: Not on file    Attends religious service: Not on file    Active member of club or organization: Not on file    Attends meetings of clubs or organizations: Not on file    Relationship status: Not on file  Other Topics Concern  . Not on file  Social History Narrative  . Not on file   Additional Social History:    Allergies:   Allergies  Allergen Reactions  . Lisinopril Swelling  . Peanut-Containing Drug Products Swelling    Labs:  Results for orders placed or performed during the hospital encounter of 05/30/17 (from the past 48 hour(s))  Rapid urine drug screen (hospital performed)     Status: Abnormal   Collection Time: 05/30/17 12:19 PM  Result Value Ref Range   Opiates NONE DETECTED NONE DETECTED   Cocaine POSITIVE (A) NONE DETECTED   Benzodiazepines NONE DETECTED NONE DETECTED   Amphetamines  NONE DETECTED NONE DETECTED   Tetrahydrocannabinol POSITIVE (A) NONE DETECTED   Barbiturates NONE DETECTED NONE DETECTED    Comment: (NOTE) DRUG SCREEN FOR MEDICAL PURPOSES ONLY.  IF CONFIRMATION IS NEEDED FOR ANY PURPOSE, NOTIFY LAB WITHIN 5 DAYS. LOWEST DETECTABLE LIMITS FOR URINE DRUG SCREEN Drug Class                     Cutoff (ng/mL) Amphetamine and metabolites    1000 Barbiturate and metabolites    200 Benzodiazepine                 295 Tricyclics and metabolites     300 Opiates and metabolites        300 Cocaine and metabolites        300 THC                            50 Performed at [facility], [place] [address], [place], [place] [address]   Comprehensive metabolic panel     Status: Abnormal   Collection Time: 05/30/17 12:40 PM  Result Value Ref Range   Sodium 139 135 - 145 mmol/L   Potassium 3.2 (L) 3.5 - 5.1 mmol/L   Chloride 103 101 - 111 mmol/L   CO2 27 22 - 32 mmol/L   Glucose, Bld 92 65 - 99 mg/dL   BUN <5 (L) 6 - 20 mg/dL   Creatinine, Ser 0.85 0.61 - 1.24 mg/dL   Calcium 8.7 (L) 8.9  - 10.3 mg/dL   Total Protein 7.2 6.5 - 8.1 g/dL   Albumin 3.4 (L) 3.5 - 5.0 g/dL   AST 37 15 - 41 U/L   ALT 21 17 - 63 U/L   Alkaline Phosphatase 82 38 - 126 U/L   Total Bilirubin 0.7 0.3 - 1.2 mg/dL   GFR calc non Af Amer >60 >60 mL/min   GFR calc Af Amer >60 >60 mL/min    Comment: (NOTE) The eGFR has been calculated using the CKD EPI equation. This calculation has not been validated in all clinical situations. eGFR's persistently <60 mL/min signify possible Chronic Kidney Disease.    Anion gap 9 5 - 15    Comment: Performed at [facility] [address] [place], [place] [address]  Ethanol     Status: None   Collection Time: 05/30/17 12:40 PM  Result Value Ref Range   Alcohol, Ethyl (B) <10 <10 mg/dL    Comment: (NOTE) Lowest detectable limit for serum alcohol is 10 mg/dL. For medical purposes only. Performed at [facility], [place] [address] [place], [place] [address]   Salicylate level     Status: None   Collection Time: 05/30/17 12:40 PM  Result Value Ref Range   Salicylate Lvl <3.6 2.8 - 30.0 mg/dL    Comment: Performed at [facility] [address] [place], [place] [address]  Acetaminophen level     Status: Abnormal   Collection Time: 05/30/17 12:40 PM  Result Value Ref Range   Acetaminophen (Tylenol), Serum <10 (L) 10 - 30 ug/mL    Comment: (NOTE) Therapeutic concentrations vary significantly. A range of 10-30 ug/mL  may be an effective concentration for many patients. However, some  are best treated at concentrations outside of this range. Acetaminophen concentrations >150 ug/mL at 4 hours after ingestion  and >50 ug/mL at 12 hours after ingestion are often associated with  toxic reactions.  Performed at [facility], [place] [address], [place], [place] [address]   cbc     Status: Abnormal   Collection Time: 05/30/17 12:40 PM  Result Value Ref Range   WBC 6.7 4.0 - 10.5 K/uL   RBC 3.84 (L) 4.22 - 5.81 MIL/uL   Hemoglobin 11.1 (L)  13.0 - 17.0 g/dL   HCT 34.2 (L) 39.0 - 52.0 %   MCV 89.1 78.0 - 100.0 fL   MCH 28.9 26.0 - 34.0 pg   MCHC 32.5 30.0 - 36.0 g/dL   RDW 15.7 (H) 11.5 - 15.5 %   Platelets 348 150 - 400 K/uL    Comment: Performed at [facility] [address], [place], [place] [address]  I-stat troponin, ED     Status: None   Collection Time: 05/30/17 12:44 PM  Result Value Ref Range   Troponin i, poc 0.00 0.00 - 0.08 ng/mL   Comment 3            Comment: Due to the release kinetics of cTnI, a negative result within the first hours of the onset of symptoms does not rule out myocardial infarction with certainty. If myocardial infarction is still suspected, repeat the test at appropriate intervals.     Medications:  Current Facility-Administered Medications  Medication Dose Route Frequency Provider Last Rate Last Dose  . acetaminophen (TYLENOL) tablet 650 mg  650 mg Oral Q4H PRN Mackuen, Courteney Lyn, MD   650 mg at 06/01/17 0940  . amLODipine (NORVASC) tablet 10 mg  10 mg Oral Daily Drenda Freeze, MD   10 mg at 06/01/17 0940  . atorvastatin (LIPITOR) tablet 20 mg  20 mg Oral q1800 Drenda Freeze, MD   20 mg at 05/31/17 8850  . citalopram (CELEXA) tablet 40 mg  40 mg Oral Daily Drenda Freeze, MD   40 mg at 06/01/17 0940  . ibuprofen (ADVIL,MOTRIN) tablet 600 mg  600 mg Oral Q8H PRN Mackuen, Courteney Lyn, MD   600 mg at 06/01/17 0940  . magnesium hydroxide (MILK OF MAGNESIA) suspension 30 mL  30 mL Oral Daily PRN Mackuen, Courteney Lyn, MD   30 mL at 05/31/17 2151  . naphazoline-glycerin (CLEAR EYES REDNESS) ophth solution 1-2 drop  1-2 drop Both Eyes QID PRN Mackuen, Courteney Lyn, MD   2 drop at 05/31/17 1550  . nicotine (NICODERM CQ - dosed in mg/24 hours) patch 21 mg  21 mg Transdermal Daily Dorie Rank, MD   21 mg at 06/01/17 0940  . tamsulosin (FLOMAX) capsule 0.4 mg  0.4 mg Oral Daily Noemi Chapel, MD   0.4 mg at 06/01/17 2774   Current Outpatient Medications  Medication  Sig Dispense Refill  . albuterol (PROVENTIL HFA;VENTOLIN HFA) 108 (90 Base) MCG/ACT inhaler Inhale 2 puffs into the lungs every 4 (four) hours as needed (sob). (Patient taking differently: Inhale 2 puffs into the lungs every 4 (four) hours as needed for shortness of breath. ) 1 Inhaler 0  . amLODipine (NORVASC) 10 MG tablet Take 1 tablet (10 mg total) by mouth daily. For high blood pressure 30 tablet 0  . atorvastatin (LIPITOR) 20 MG tablet Take 1 tablet (20 mg total) by mouth daily at 6 PM. 30 tablet 0  . citalopram (CELEXA) 40 MG tablet Take 1 tablet (40 mg total) by mouth daily. For mood control 30 tablet 0  . metoprolol tartrate (LOPRESSOR) 50 MG tablet Take 1 tablet (50 mg total) by mouth 2 (two) times  daily. For high blood pessure 60 tablet 0  . tamsulosin (FLOMAX) 0.4 MG CAPS capsule Take 1 capsule (0.4 mg total) by mouth daily after supper. 30 capsule 0  . famotidine (PEPCID) 20 MG tablet Take 1 tablet (20 mg total) by mouth 2 (two) times daily. For acid reflux (Patient not taking: Reported on 05/30/2017) 60 tablet 0  . hydrOXYzine (ATARAX/VISTARIL) 25 MG tablet Take 1 tablet (25 mg total) by mouth every 6 (six) hours as needed for anxiety. (Patient not taking: Reported on 05/30/2017) 30 tablet 0  . traZODone (DESYREL) 50 MG tablet Take 1 tablet (50 mg total) by mouth at bedtime as needed for sleep. (Patient not taking: Reported on 05/30/2017) 30 tablet 0    Musculoskeletal: Strength & Muscle Tone: within normal limits Gait & Station: normal Patient leans: N/A  Psychiatric Specialty Exam: Physical Exam  Nursing note and vitals reviewed.   Review of Systems  Psychiatric/Behavioral: Positive for depression and substance abuse. Negative for hallucinations and suicidal ideas. The patient is nervous/anxious and has insomnia.   All other systems reviewed and are negative.   Blood pressure 124/85, pulse (!) 58, temperature 98 F (36.7 C), temperature source Oral, resp. rate 16, SpO2 100  %.There is no height or weight on file to calculate BMI.  General Appearance: Casual and Fairly Groomed  Eye Contact:  Good  Speech:  Clear and Coherent and Normal Rate  Volume:  Normal  Mood:  Depressed  Affect:  Appropriate and Congruent  Thought Process:  Coherent, Goal Directed, Linear and Descriptions of Associations: Intact  Orientation:  Full (Time, Place, and Person)  Thought Content:  Focused on treatment options  Suicidal Thoughts:  No  Homicidal Thoughts:  No  Memory:  Immediate;   Fair Recent;   Fair Remote;   Fair  Judgement:  Fair  Insight:  Fair  Psychomotor Activity:  Normal  Concentration:  Concentration: Fair and Attention Span: Fair  Recall:  [employer] of Knowledge:  Fair  Language:  Fair  Akathisia:  No  Handed:    AIMS (if indicated):     Assets:  Communication Skills Desire for Improvement Resilience Social Support  ADL's:  Intact  Cognition:  WNL  Sleep:      Treatment Plan Summary: Substance induced mood disorder ([place]) with MDD, recurrent, severe, without psychosis, stable for outpatient management as below:   Disposition: No evidence of imminent risk to self or others at present.   Patient does not meet criteria for psychiatric inpatient admission. Supportive therapy provided about ongoing stressors. Discussed crisis plan, support from social network, calling 911, coming to the Emergency Department, and calling Suicide Hotline.  This service was provided via telemedicine using a 2-way, interactive audio and video technology.  Names of all persons participating in this telemedicine service and their role in this encounter. Name: Lyndee Leo Role: Patient  Name: Guadelupe Sabin, DNP Role: Psych Consult  Name: Hampton Abbot, MD Role: Med Director       Benjamine Mola, FNP 06/01/2017 9:57 AM

## 2017-06-01 NOTE — ED Triage Notes (Signed)
Pt was riding in the car ago and started having L sided chest pain with SOB. Pt was seen here earlier today for something different

## 2017-06-01 NOTE — ED Provider Notes (Signed)
[facility] EMERGENCY DEPARTMENT Provider Note   CSN: [id number] Arrival date & time: 06/01/17  1921     History   Chief Complaint Chief Complaint  Patient presents with  . Chest Pain    HPI Jonathan Martin is a 62 y.o. male.  HPI  Patient is a 62 year old male with a past medical history of hypertension, cocaine abuse, depression with intermittent SI who comes in today after having been discharged less than 8 hours ago for suicidal ideation.  Patient states that approximately 30 minutes prior to arrival he was riding in a car when he experienced left-sided chest pain with associated shortness of breath.  Patient denies any alleviating or aggravating factors, nausea vomiting, diaphoresis, fevers chills.  Initially on arrival patient was mildly hypotensive and complaining of severe left-sided chest pain.  Patient denied back pain.  Past Medical History:  Diagnosis Date  . Cocaine abuse (HCC)   . Depression   . Hypertension     Patient Active Problem List   Diagnosis Date Noted  . Substance induced mood disorder (HCC) 06/01/2017  . Major depressive disorder, recurrent episode, severe (HCC) 05/11/2017  . Polysubstance abuse (HCC) 05/11/2017  . Suicidal ideations   . Alcoholic polyneuropathy (HCC)   . Chest pain 05/10/2017    History reviewed. No pertinent surgical history.      Home Medications    Prior to Admission medications   Medication Sig Start Date End Date Taking? Authorizing Provider  albuterol (PROVENTIL HFA;VENTOLIN HFA) 108 (90 Base) MCG/ACT inhaler Inhale 2 puffs into the lungs every 4 (four) hours as needed (sob). Patient taking differently: Inhale 2 puffs into the lungs every 4 (four) hours as needed for shortness of breath.  05/14/17   Money, Gerlene Burdock, FNP  amLODipine (NORVASC) 10 MG tablet Take 1 tablet (10 mg total) by mouth daily. For high blood pressure 05/14/17   Money, Gerlene Burdock, FNP  atorvastatin (LIPITOR) 20 MG tablet Take 1 tablet (20  mg total) by mouth daily at 6 PM. 05/14/17   Money, Gerlene Burdock, FNP  citalopram (CELEXA) 40 MG tablet Take 1 tablet (40 mg total) by mouth daily. For mood control 05/14/17   Money, Gerlene Burdock, FNP  famotidine (PEPCID) 20 MG tablet Take 1 tablet (20 mg total) by mouth 2 (two) times daily. For acid reflux Patient not taking: Reported on 05/30/2017 05/14/17   Money, Gerlene Burdock, FNP  hydrOXYzine (ATARAX/VISTARIL) 25 MG tablet Take 1 tablet (25 mg total) by mouth every 6 (six) hours as needed for anxiety. Patient not taking: Reported on 05/30/2017 05/14/17   Money, Gerlene Burdock, FNP  metoprolol tartrate (LOPRESSOR) 50 MG tablet Take 1 tablet (50 mg total) by mouth 2 (two) times daily. For high blood pessure 05/14/17   Money, Gerlene Burdock, FNP  tamsulosin (FLOMAX) 0.4 MG CAPS capsule Take 1 capsule (0.4 mg total) by mouth daily after supper. 05/14/17   Money, Gerlene Burdock, FNP  traZODone (DESYREL) 50 MG tablet Take 1 tablet (50 mg total) by mouth at bedtime as needed for sleep. Patient not taking: Reported on 05/30/2017 05/14/17   Money, Gerlene Burdock, FNP    Family History No family history on file.  Social History Social History   Tobacco Use  . Smoking status: Current Every Day Smoker    Packs/day: 1.00  . Smokeless tobacco: Current User  Substance Use Topics  . Alcohol use: Yes    Alcohol/week: 10.8 oz    Types: 6 Glasses of wine, 12 Cans  of beer per week    Comment: drinks daily  . Drug use: Yes    Frequency: 7.0 times per week    Types: Cocaine, Marijuana    Comment: uses daily on binges, whole paycheck     Allergies   Lisinopril and Peanut-containing drug products   Review of Systems Review of Systems  Constitutional: Negative for chills, fatigue and fever.  Respiratory: Positive for shortness of breath.   Cardiovascular: Positive for chest pain. Negative for leg swelling.  Gastrointestinal: Negative for nausea and vomiting.  Genitourinary: Negative for decreased urine volume, difficulty urinating, flank pain,  hematuria, scrotal swelling and testicular pain.  All other systems reviewed and are negative.    Physical Exam Updated Vital Signs BP 117/68   Pulse 62   Temp 98.2 F (36.8 C) (Oral)   Resp 13   SpO2 97%   Physical Exam  Constitutional: He appears well-developed and well-nourished.  HENT:  Head: Normocephalic and atraumatic.  Eyes: Conjunctivae are normal.  Neck: Neck supple.  Cardiovascular: Normal rate and regular rhythm.  No murmur heard. Pulmonary/Chest: Effort normal and breath sounds normal. No respiratory distress.  Abdominal: Soft. There is no tenderness.  Musculoskeletal: He exhibits no edema.  Neurological: He is alert.  Skin: Skin is warm and dry.  Psychiatric: He has a normal mood and affect.  Nursing note and vitals reviewed.    ED Treatments / Results  Labs (all labs ordered are listed, but only abnormal results are displayed) Labs Reviewed  BASIC METABOLIC PANEL - Abnormal; Notable for the following components:      Result Value   Potassium 3.3 (*)    Chloride 100 (*)    Glucose, Bld 153 (*)    All other components within normal limits  CBC - Abnormal; Notable for the following components:   RBC 3.87 (*)    Hemoglobin 11.3 (*)    HCT 34.9 (*)    All other components within normal limits  I-STAT TROPONIN, ED  I-STAT TROPONIN, ED    EKG None  Radiology Dg Chest 2 View  Result Date: 06/01/2017 CLINICAL DATA:  Left chest pain and shortness of breath. EXAM: CHEST - 2 VIEW COMPARISON:  05/10/2017. FINDINGS: Normal sized heart. Mildly increased tortuosity of the thoracic aorta with a mildly prominent arch. Clear lungs. The lungs remain mildly hyperexpanded with minimally prominent interstitial markings. Diffuse osteopenia. IMPRESSION: 1. Mildly progressive tortuosity and prominence of the thoracic aorta, raising the possibility of aortic dissection. If this is a clinical concern, a chest, abdomen and pelvis CTA would be recommended. 2. Stable mild  changes of COPD. Electronically Signed   By: Beckie Salts M.D.   On: 06/01/2017 19:50   Ct Angio Chest/abd/pel For Dissection W And/or Wo Contrast  Result Date: 06/01/2017 CLINICAL DATA:  Left chest pain and shortness of breath. Mildly increased prominence and tortuosity of the thoracic aorta on chest radiographs earlier today, raising a question of aortic dissection. EXAM: CT ANGIOGRAPHY CHEST, ABDOMEN AND PELVIS TECHNIQUE: Multidetector CT imaging through the chest, abdomen and pelvis was performed using the standard protocol during bolus administration of intravenous contrast. Multiplanar reconstructed images and MIPs were obtained and reviewed to evaluate the vascular anatomy. CONTRAST:  ISOVUE-370 IOPAMIDOL (ISOVUE-370) INJECTION 76% COMPARISON:  Chest radiographs obtained earlier today. FINDINGS: CTA CHEST FINDINGS Cardiovascular: Atheromatous calcifications, including the coronary arteries and aorta. The aorta is mildly tortuous without aneurysm or dissection. Normally opacified pulmonary arteries with no pulmonary arterial filling defects seen. Mediastinum/Nodes: No  enlarged mediastinal, hilar, or axillary lymph nodes. Thyroid gland, trachea, and esophagus demonstrate no significant findings. Lungs/Pleura: Lungs are clear with the exception of minimal bilateral dependent atelectasis. No pleural effusion or pneumothorax. Musculoskeletal: Thoracic and lower cervical spine degenerative changes. Review of the MIP images confirms the above findings. CTA ABDOMEN AND PELVIS FINDINGS VASCULAR Aorta: Mildly dilated proximal abdominal aorta with a maximum diameter of 3.7 cm. The infrarenal abdominal aorta is normal in caliber. Atheromatous calcifications. Celiac: Patent without evidence of aneurysm, dissection, vasculitis or significant stenosis. SMA: Mild proximal atheromatous calcifications without stenosis. Renals: Mild atheromatous calcifications at the origins without stenosis. IMA: Mild atheromatous  calcification at the origin causing approximately 50% stenosis. Inflow: Atheromatous calcifications with a short segment occlusion of the right internal iliac artery and approximately 90% stenosis of the reconstituted portion proximally. There is also approximately 80% stenosis of a short segment of the left internal iliac artery. No significant common iliac or external iliac artery stenoses or significant femoral artery stenoses. Veins: No obvious venous abnormality within the limitations of this arterial phase study. Review of the MIP images confirms the above findings. NON-VASCULAR Hepatobiliary: Prominent lateral segment left lobe of the liver and caudate lobe with no significant liver nodularity and normal sized right lobe. Normal appearing gallbladder. Pancreas: Unremarkable. No pancreatic ductal dilatation or surrounding inflammatory changes. Spleen: Normal in size without focal abnormality. Adrenals/Urinary Tract: Normal appearing adrenal glands, kidneys and ureters. Mild diffuse bladder wall thickening. A portion of the bladder is extending into a left inguinal hernia which also contains fat. Stomach/Bowel: Mild colonic diverticulosis without diverticulitis. Normal appearing appendix, small bowel and stomach. Lymphatic: Mildly prominent lymph nodes at the root of the mesentery. The largest has a short axis diameter of 10 mm on image number 99 series 6. Otherwise, no enlarged lymph nodes are seen. Reproductive: Moderately enlarged prostate gland and seminal vesicles. Other: Moderate size left inguinal hernia containing fat and a portion of the urinary bladder. Small right inguinal hernia containing fat. Musculoskeletal: Left iliac bone islands. Mild lumbar spine degenerative changes. Review of the MIP images confirms the above findings. IMPRESSION: 1. No aortic dissection. 2. 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 Recommend followup by ultrasound in 2  years. This recommendation follows ACR consensus guidelines: White Paper of the ACR Incidental Findings Committee II on Vascular Findings. J Am Coll Radiol 2013; 10:789-794. 3. Moderate-sized left inguinal hernia containing fat and a portion of the urinary bladder. 4. Moderate prostatic hypertrophy. 5. Mild diffuse bladder wall thickening compatible with chronic bladder outlet obstruction by the enlarged prostate gland. 6. Mildly prominent lymph nodes at the root of the mesentery. These are most likely reactive. 7. Atheromatous changes with occlusion of a short segment of the right internal iliac artery and 80% stenosis of a short segment of the left internal iliac artery. 8. Approximately 50% stenosis of the proximal inferior mesenteric artery. Electronically Signed   By: Beckie Salts M.D.   On: 06/01/2017 21:35    Procedures Procedures (including critical care time)  Medications Ordered in ED Medications  iopamidol (ISOVUE-370) 76 % injection 100 mL (100 mLs Intravenous Contrast Given 06/01/17 2059)     Initial Impression / Assessment and Plan / ED Course  I have reviewed the triage vital signs and the nursing notes.  Pertinent labs & imaging results that were available during my care of the patient were reviewed by me and considered in my medical decision making (see chart  for details).     Given patient's presentation, initial concern for aortic dissection, CTA shows no evidence of aortic dissection.  CT shows inguinal hernia containing a part of his urinary bladder, patient denies any urinary retention or difficulty urinating.  Patient denies abdominal pain.  EKG unchanged from prior.  Laboratory work-up largely within normal limits without concerning findings.  Patient with normal troponin x2. Pt given appropriate f/u and return precautions. Pt voiced understanding and is agreeable to discharge at this time.    Final Clinical Impressions(s) / ED Diagnoses   Final diagnoses:    Nonspecific chest pain    ED Discharge Orders    None       Caren Griffins, MD 06/02/17 1529    Rolland Porter, MD 06/05/17 [phone]

## 2017-06-01 NOTE — ED Notes (Signed)
Declined W/C at D/C and was escorted to lobby by RN. 

## 2017-06-01 NOTE — ED Triage Notes (Signed)
SW in room to see PT. Pt provided with a ciy bus pass and a PART bus pass at his request.

## 2017-06-01 NOTE — ED Notes (Signed)
Patient walked to the phone at the front desk.  Stated he was making a phone call. Staff mentioned that he "only got 2 phone calls a day and he responded stating he didn't want to play games with you."  Staff mentioned it wasn't a game.  Patient then proceeded with phone call.

## 2017-06-01 NOTE — ED Triage Notes (Signed)
TTS done 

## 2017-06-01 NOTE — Discharge Instructions (Addendum)
Please see day [facility] for a refill of your medications and ongoing evaluationSubstance Abuse Treatment Programs  Intensive Outpatient Programs [facility]     [address]      [place], [place]                   [phone]       [facility] [address] [place], [place] [phone]  [facility]     (Inpatient and outpatient)     [address]           [phone]    [facility] [phone] (Suboxone and Methadone)  [address]      [place], [place] [address]      [phone]       [address] [place], [place] [phone]  [facility] (Outpatient/Inpatient, Chemical)    (insurance only) [phone]             [facility] (Groups & Residential) [place], [place] [phone]     [facility]     [address]     [place], [place]      [phone]       [facility] (for caregivers and family) [phone] [place], [place] [phone]      Residential Treatment Programs [facility]      [address] [place], [place] [address]  [phone]       [facility] [address], [place], [place] [address] [phone]  [facility] [place]        [phone]       [facility] [phone]  [facility]             [address]                                         [place], [place]                                                [phone] or [phone]                               [facility] [address] [place], [address] [phone]  [facility]    [address]      [place], [place]     [phone]       [facility] [address] [place], [place] [phone]  [facility]   [address] [place], [place] [address]     [phone]      Admissions: 8am-3pm  M-F  [facility] ([facility]) [address] [place], [place] [phone]  [facility]: Residential Program [phone] Days)   [place], [place]      [phone] or [phone]     [facility]: [facility] [place], [place] (Walk in Hours over the weekend or by referral)  [facility] [address] [place], [place], [place] [address] [phone]  Crisis Mobile: [facility]:  [phone] (for crisis response 24 hours a day) [facility] Hotline:  (574) [phone] Outpatient Psychiatry and Counseling  [facility]: Mobile Crisis Management 24 hours:  [phone]  [facility] [employer] sliding scale fee and walk in schedule: M-F 8am-12pm/1pm-3pm [place], [place] [address] [address] [place], [place] [address] [phone]  [facility] ([facility])- new patient walk-in appointments available Monday - Friday 8am -3pm.          [address] [place], [place] [address] [phone] or crisis line- [facility] [facility]/ Intensive Outpatient Therapy Program [place], [place] [address] [place]      [phone] [address] [place], [place] [address]                 [place]   [facility] [phone]. [address] [place], [place] [address]   [facility]          [address] Johnette Abraham  [place], [place] [address]       [phone]  [facility] [address], [place], [place] [address] [phone]  [facility]    [address] [place], [place] [address]     [place], [place] Joycelyn Man     [place]  [address]     [phone]       [facility] [place] [address]  [facility]     [address], [place], [place], [place] [address] [place]     [address]     [place], [place] [address]     [phone]       [facility] [address] [place], [place] [address] [phone] Resources for Temporary Residential Assistance/Crisis [place] [facility]) M-F 8am-3pm   [address] [place], [place] [address]   [phone] Services include: laundry, barbering, support groups, case management, phone  & computer access, showers, AA/NA mtgs, mental health/substance abuse nurse, job skills class, disability information, VA assistance, spiritual classes, etc.   [facility]   [address], [place]     [place]              [employer] (women and children)       [place]. [place], [place] [address] [phone] [email or web address] for application and process Application Required  [facility]   [address]    [place] [address]     [phone]                    [place], [place] [address] [id number] application appt.) Application Required  [employer] (women only)    [place], [place]  [place]      Intake starts 6pm daily Need valid ID, SSC, & Police report [employer] [address] [place], [place] [id number] Application Required  [employer] (men only)     [place]      [place], [place]       [place] (Pregnant  women only) [address] [place], [place]  [facility]      [address]      [place], [place] [address]     [phone]             [facility] [address] [place], [place] 90 day commitment/SA/Application process  [facility](men only)     [address]     [place], [place]       Check-in at [facility] [address] [place], [place] [address] [phone] Men/Women/Women and Children must be there by 7 pm  [place], [place]

## 2017-06-01 NOTE — Progress Notes (Signed)
Patient called TTS at [facility] to discuss inpatient treatment options.  Pt. requested long inpatient treatment at [facility].   Writer explained that we have short term inpatient treatment at [facility]. Patient then changed his mind and said that he wants to come here for 5 days. Pt wants to be contacted directly at [facility] at [phone].  Writer informed that if we have a bed at [facility], writer will contact patient's nurse with placement information; and if we don't have a bed, writer will look for a bed at other hospitals in the area.   Patient reported understanding.  Writer to locate placement for pt.  [place] Abts, MSW, LCSWA Clinical social worker in disposition [facility], TTS Office [phone] and [phone] 06/01/2017 9:56 AM

## 2017-06-01 NOTE — ED Provider Notes (Signed)
Discussed with psychiatry, they state that he does have a history of cocaine abuse, he is no longer suicidal and denies that to them.  They state that he wants a place to stay and he is homeless.  He is social work will see the patient to give outpatient resources and he can follow-up at [facility] for medications.  The patient is stable for discharge at this time   Eber Hong, MD 06/01/17 1402

## 2017-06-01 NOTE — Progress Notes (Signed)
CSW consulted by RN to speak with pt tat bedside. CSW spoke with pt and was informed that pt has been cleared and is needing to get to [facility]. CSW provided pt with bus pass to get pt to next desitination CSW offered pt resources for [facility] and pt expressed not wanting this resources at this time. There are no further CSW needs at this time. CSW will sign off.    Claude Manges. Adell Koval, MSW, LCSW-A Emergency Department Clinical Social Worker [phone]

## 2017-10-04 ENCOUNTER — Encounter (HOSPITAL_COMMUNITY): Payer: Self-pay | Admitting: Emergency Medicine

## 2017-10-04 ENCOUNTER — Emergency Department (HOSPITAL_COMMUNITY): Payer: Medicare Other

## 2017-10-04 ENCOUNTER — Emergency Department (HOSPITAL_COMMUNITY)
Admission: EM | Admit: 2017-10-04 | Discharge: 2017-10-04 | Disposition: A | Discharge status: Home / Self Care | Payer: Medicare Other | Attending: Emergency Medicine | Admitting: Emergency Medicine

## 2017-10-04 DIAGNOSIS — J45909 Unspecified asthma, uncomplicated: Secondary | ICD-10-CM | POA: Diagnosis not present

## 2017-10-04 DIAGNOSIS — F17200 Nicotine dependence, unspecified, uncomplicated: Secondary | ICD-10-CM | POA: Insufficient documentation

## 2017-10-04 DIAGNOSIS — I10 Essential (primary) hypertension: Secondary | ICD-10-CM | POA: Diagnosis not present

## 2017-10-04 DIAGNOSIS — R0789 Other chest pain: Secondary | ICD-10-CM | POA: Diagnosis not present

## 2017-10-04 DIAGNOSIS — Z91010 Allergy to peanuts: Secondary | ICD-10-CM | POA: Insufficient documentation

## 2017-10-04 DIAGNOSIS — Z79899 Other long term (current) drug therapy: Secondary | ICD-10-CM | POA: Insufficient documentation

## 2017-10-04 HISTORY — DX: Unspecified asthma, uncomplicated: J45.909

## 2017-10-04 LAB — BASIC METABOLIC PANEL
ANION GAP: 7 (ref 5–15)
BUN: 11 mg/dL (ref 8–23)
CALCIUM: 8.6 mg/dL — AB (ref 8.9–10.3)
CO2: 25 mmol/L (ref 22–32)
Chloride: 106 mmol/L (ref 98–111)
Creatinine, Ser: 1.07 mg/dL (ref 0.61–1.24)
GFR calc Af Amer: >60 mL/min (ref >60)
GLUCOSE: 82 mg/dL (ref 70–99)
POTASSIUM: 3.7 mmol/L (ref 3.5–5.1)
Sodium: 138 mmol/L (ref 135–145)

## 2017-10-04 LAB — CBC
HCT: 34.9 % — ABNORMAL LOW (ref 39.0–52.0)
HEMOGLOBIN: 11.1 g/dL — AB (ref 13.0–17.0)
MCH: 28.6 pg (ref 26.0–34.0)
MCHC: 31.8 g/dL (ref 30.0–36.0)
MCV: 89.9 fL (ref 78.0–100.0)
Platelets: 303 10*3/uL (ref 150–400)
RBC: 3.88 MIL/uL — ABNORMAL LOW (ref 4.22–5.81)
RDW: 18 % — AB (ref 11.5–15.5)
WBC: 7.4 10*3/uL (ref 4.0–10.5)

## 2017-10-04 LAB — I-STAT TROPONIN, ED
TROPONIN I, POC: 0 ng/mL (ref 0.00–0.08)
TROPONIN I, POC: 0 ng/mL (ref 0.00–0.08)

## 2017-10-04 MED ORDER — METOPROLOL TARTRATE 50 MG PO TABS: 50.0000 mg | ORAL_TABLET | Freq: Two times a day (BID) | ORAL | 0 refills | Status: DC

## 2017-10-04 MED ORDER — AMLODIPINE BESYLATE 10 MG PO TABS: 10.0000 mg | ORAL_TABLET | Freq: Every day | ORAL | 0 refills | Status: DC

## 2017-10-04 MED ORDER — ACETAMINOPHEN 325 MG PO TABS
650.0000 mg | ORAL_TABLET | Freq: Once | ORAL | Status: AC
  Administered 2017-10-04: 650 mg via ORAL
  Filled 2017-10-04: qty 2

## 2017-10-04 MED ORDER — ALBUTEROL SULFATE HFA 108 (90 BASE) MCG/ACT IN AERS: 2.0000 | INHALATION_SPRAY | RESPIRATORY_TRACT | 0 refills | Status: DC | PRN

## 2017-10-04 MED ORDER — CITALOPRAM HYDROBROMIDE 40 MG PO TABS: 40.0000 mg | ORAL_TABLET | Freq: Every day | ORAL | 0 refills | Status: DC

## 2017-10-04 MED ORDER — TAMSULOSIN HCL 0.4 MG PO CAPS: 0.4000 mg | ORAL_CAPSULE | Freq: Every day | ORAL | 0 refills | Status: AC

## 2017-10-04 NOTE — ED Triage Notes (Signed)
Pt  C/o cp and sob after being thrown out of hotel room , all his asthma meds are  In that room , states cannot get back in

## 2017-10-04 NOTE — ED Notes (Signed)
Security contacted to be present for pt's discharge.

## 2017-10-04 NOTE — Discharge Instructions (Addendum)
Seen today for evaluation of chest pain.  Your lab results, EKG were negative.  Please call the primary care provider for reevaluation.  Please return to the ED with any new or worsening symptoms such as:  Get help right away if: Your chest pain is worse. You have a cough that gets worse, or you cough up blood. You have very bad (severe) pain in your belly (abdomen). You are very weak. You pass out (faint). You have either of these for no clear reason: Sudden chest discomfort. Sudden discomfort in your arms, back, neck, or jaw. You have shortness of breath at any time. You suddenly start to sweat, or your skin gets clammy. You feel sick to your stomach (nauseous). You throw up (vomit). You suddenly feel light-headed or dizzy. Your heart starts to beat fast, or it feels like it is skipping beats.

## 2017-10-04 NOTE — ED Provider Notes (Signed)
[facility] EMERGENCY DEPARTMENT Provider Note  CSN: [id number] Arrival date & time: 10/04/17  1419  History   Chief Complaint Chief Complaint  Patient presents with  . Chest Pain    HPI Jonathan Martin is a 62 y.o. male the past medical history significant for hypertension, depression, asthma, substance induced mood disorder who presents for evaluation of chest pain.  Patient states he was kicked out of the place he was staying in this morning and developed sudden onset chest pain and shortness of breath.  States his chest pain is been constant in nature.  His pain does not radiate.  Has had intermittent shortness of breath.  Denies aggravating or alleviating factors.  Patient admits to history of suicidal ideation however denies current SI, HI, audiovisual hallucinations, depressive symptoms.  Patient states "I have not had those thoughts in a couple of weeks."  HPI  Past Medical History:  Diagnosis Date  . Asthma   . Cocaine abuse (HCC)   . Depression   . Hypertension     Patient Active Problem List   Diagnosis Date Noted  . Substance induced mood disorder (HCC) 06/01/2017  . Major depressive disorder, recurrent episode, severe (HCC) 05/11/2017  . Polysubstance abuse (HCC) 05/11/2017  . Suicidal ideations   . Alcoholic polyneuropathy (HCC)   . Chest pain 05/10/2017    History reviewed. No pertinent surgical history.      Home Medications    Prior to Admission medications   Medication Sig Start Date End Date Taking? Authorizing Provider  albuterol (PROVENTIL HFA;VENTOLIN HFA) 108 (90 Base) MCG/ACT inhaler Inhale 2 puffs into the lungs every 4 (four) hours as needed (sob). Patient taking differently: Inhale 2 puffs into the lungs every 4 (four) hours as needed for shortness of breath.  05/14/17   Money, Gerlene Burdock, FNP  amLODipine (NORVASC) 10 MG tablet Take 1 tablet (10 mg total) by mouth daily. For high blood pressure 05/14/17   Money, Gerlene Burdock, FNP    atorvastatin (LIPITOR) 20 MG tablet Take 1 tablet (20 mg total) by mouth daily at 6 PM. 05/14/17   Money, Gerlene Burdock, FNP  citalopram (CELEXA) 40 MG tablet Take 1 tablet (40 mg total) by mouth daily. For mood control 05/14/17   Money, Gerlene Burdock, FNP  famotidine (PEPCID) 20 MG tablet Take 1 tablet (20 mg total) by mouth 2 (two) times daily. For acid reflux Patient not taking: Reported on 05/30/2017 05/14/17   Money, Gerlene Burdock, FNP  hydrOXYzine (ATARAX/VISTARIL) 25 MG tablet Take 1 tablet (25 mg total) by mouth every 6 (six) hours as needed for anxiety. Patient not taking: Reported on 05/30/2017 05/14/17   Money, Gerlene Burdock, FNP  metoprolol tartrate (LOPRESSOR) 50 MG tablet Take 1 tablet (50 mg total) by mouth 2 (two) times daily. For high blood pessure 05/14/17   Money, Gerlene Burdock, FNP  tamsulosin (FLOMAX) 0.4 MG CAPS capsule Take 1 capsule (0.4 mg total) by mouth daily after supper. 05/14/17   Money, Gerlene Burdock, FNP  traZODone (DESYREL) 50 MG tablet Take 1 tablet (50 mg total) by mouth at bedtime as needed for sleep. Patient not taking: Reported on 05/30/2017 05/14/17   Money, Gerlene Burdock, FNP    Family History No family history on file.  Social History Social History   Tobacco Use  . Smoking status: Current Every Day Smoker    Packs/day: 1.00  . Smokeless tobacco: Current User  Substance Use Topics  . Alcohol use: Yes    Alcohol/week:  18.0 standard drinks    Types: 6 Glasses of wine, 12 Cans of beer per week    Comment: drinks daily  . Drug use: Yes    Frequency: 7.0 times per week    Types: Cocaine, Marijuana    Comment: uses daily on binges, whole paycheck     Allergies   Lisinopril and Peanut-containing drug products   Review of Systems Review of Systems  Constitutional: Negative for activity change, appetite change, chills, diaphoresis, fatigue, fever and unexpected weight change.  Respiratory: Positive for shortness of breath. Negative for apnea, cough, choking, chest tightness, wheezing and  stridor.   Cardiovascular: Positive for chest pain. Negative for palpitations and leg swelling.  Gastrointestinal: Negative for abdominal distention, abdominal pain, diarrhea, nausea and vomiting.  Musculoskeletal: Negative for back pain, neck pain and neck stiffness.  Skin: Negative.   Neurological: Negative for dizziness, weakness, light-headedness, numbness and headaches.     Physical Exam Updated Vital Signs BP 126/85   Pulse (!) 55   Temp 98 F (36.7 C) (Oral)   Resp 13   SpO2 98%   Physical Exam  Constitutional: He appears well-developed and well-nourished.  Non-toxic appearance. He does not appear ill. No distress.  HENT:  Head: Normocephalic and atraumatic.  Eyes: Pupils are equal, round, and reactive to light.  Neck: Normal range of motion. Neck supple. No JVD present. No tracheal deviation present.  Cardiovascular: Normal rate, regular rhythm and normal pulses.  Pulmonary/Chest: Effort normal and breath sounds normal. No accessory muscle usage or stridor. No tachypnea. No respiratory distress. He has no decreased breath sounds. He has no wheezes. He has no rhonchi. He has no rales.  Abdominal: Soft. Bowel sounds are normal. He exhibits no distension, no ascites and no mass. There is no splenomegaly or hepatomegaly. There is no tenderness. There is no rebound and no guarding.  Musculoskeletal: Normal range of motion.       Right lower leg: Normal.       Left lower leg: Normal.  Neurological: He is alert.  Skin: Skin is warm and dry. He is not diaphoretic.  Psychiatric: He has a normal mood and affect. His speech is normal and behavior is normal. His mood appears not anxious. His affect is not angry, not blunt, not labile and not inappropriate. Thought content is not paranoid and not delusional. He does not exhibit a depressed mood. He expresses no homicidal and no suicidal ideation. He expresses no suicidal plans and no homicidal plans.  Patient with normal affect and mood.   Denies depressive symptoms.  Denies current SI, HI, AVH.  Speech is normal and not rapid or pressured.    Nursing note and vitals reviewed.    ED Treatments / Results  Labs (all labs ordered are listed, but only abnormal results are displayed) Labs Reviewed  BASIC METABOLIC PANEL - Abnormal; Notable for the following components:      Result Value   Calcium 8.6 (*)    All other components within normal limits  CBC - Abnormal; Notable for the following components:   RBC 3.88 (*)    Hemoglobin 11.1 (*)    HCT 34.9 (*)    RDW 18.0 (*)    All other components within normal limits  I-STAT TROPONIN, ED  I-STAT TROPONIN, ED    EKG EKG Interpretation  Date/Time:  Sunday October 04 2017 14:22:49 EDT Ventricular Rate:  57 PR Interval:  176 QRS Duration: 92 QT Interval:  448 QTC Calculation: 436 R  Axis:   67 Text Interpretation:  Sinus bradycardia Minimal voltage criteria for LVH, may be normal variant Cannot rule out Anterior infarct , age undetermined Abnormal ECG Since last tracing rate slower Confirmed by Tilden Fossa [phone]) on 10/04/2017 6:55:54 PM   Radiology Dg Chest 2 View  Result Date: 10/04/2017 CLINICAL DATA:  Chest pain EXAM: CHEST - 2 VIEW COMPARISON:  CT chest 06/01/2017 FINDINGS: The heart size and mediastinal contours are within normal limits. Both lungs are clear. The visualized skeletal structures are unremarkable. IMPRESSION: No active cardiopulmonary disease. Electronically Signed   By: Elige Ko   On: 10/04/2017 15:17    Procedures Procedures (including critical care time)  Medications Ordered in ED Medications  acetaminophen (TYLENOL) tablet 650 mg (650 mg Oral Given 10/04/17 2124)     Initial Impression / Assessment and Plan / ED Course  I have reviewed the triage vital signs and the nursing notes as well as his past medical history.  Pertinent labs & imaging results that were available during my care of the patient were reviewed by me and  considered in my medical decision making (see chart for details).  62 year old male with history of chronic cocaine use, history of SI, chronic chest pain presents for evaluation of chest pain.  Denies current HI, SI, AVH.  Was admitted [facility]/11/2017 [facility] for similar symptoms and negative work up.   Troponin x2 negative, x-ray without signs of infiltrates, pulmonary edema, pneumothorax, widened mediastinum.  EKG with sinus bradycardia at 57 bpm, no signs of ST elevation or Q waves.  No leukocytosis, hemoglobin 11.1, compared to previous visits is at patient's baseline.  Discussed with patient results. Patient seem agitated on dc. Requesting his home medications to be filled. Will fill his home meds for 7 days and have him follow-up with his PCP for additional medications. BP has been normotensive during visit. Did have elevated BP at dc with patient agitated and moving about in bed. Do not feel this is an accurate reading.  Chest pain is not likely of cardiac or pulmonary etiology d/t presentation, VSS, no tracheal deviation, no JVD or new murmur, RRR, breath sounds equal bilaterally, EKG without acute abnormalities, negative troponin, and negative CXR. Pt has been advised to return to the ED if CP becomes exertional, associated with diaphoresis or nausea, radiates to left jaw/arm, worsens or becomes concerning in any way. Pt appears reliable for follow up and is agreeable to discharge.   Patient has been discussed and seen by my attending, Dr. Madilyn Hook, who agrees with disposition and plan of patient.   Final Clinical Impressions(s) / ED Diagnoses   Final diagnoses:  Atypical chest pain    ED Discharge Orders    None       Kaylenn Civil A, PA-C 10/04/17 2219    Tilden Fossa, MD 10/06/17 1556

## 2017-10-05 ENCOUNTER — Emergency Department (HOSPITAL_COMMUNITY): Admission: EM | Admit: 2017-10-05 | Payer: Medicare Other | Source: Home / Self Care

## 2017-10-05 NOTE — ED Notes (Signed)
While in [place] to retrieve another patient, Mr. Winrow states he has an item in his pocket to "hurt himself". Pt repeated himself when what he has, pt presented small screwdriver stating "I could stab myself in the neck with that". [employer] and security aware, screw driver given to triage nurse.

## 2017-10-08 ENCOUNTER — Emergency Department (HOSPITAL_COMMUNITY)
Admission: EM | Admit: 2017-10-08 | Discharge: 2017-10-09 | Disposition: A | Discharge status: Home / Self Care | Payer: Medicare Other | Attending: Emergency Medicine | Admitting: Emergency Medicine

## 2017-10-08 ENCOUNTER — Emergency Department (HOSPITAL_COMMUNITY): Payer: Medicare Other

## 2017-10-08 ENCOUNTER — Encounter (HOSPITAL_COMMUNITY): Payer: Self-pay | Admitting: Emergency Medicine

## 2017-10-08 ENCOUNTER — Other Ambulatory Visit: Payer: Self-pay

## 2017-10-08 DIAGNOSIS — R55 Syncope and collapse: Secondary | ICD-10-CM | POA: Insufficient documentation

## 2017-10-08 DIAGNOSIS — F10920 Alcohol use, unspecified with intoxication, uncomplicated: Secondary | ICD-10-CM | POA: Insufficient documentation

## 2017-10-08 DIAGNOSIS — I959 Hypotension, unspecified: Secondary | ICD-10-CM | POA: Diagnosis not present

## 2017-10-08 DIAGNOSIS — Z79899 Other long term (current) drug therapy: Secondary | ICD-10-CM | POA: Insufficient documentation

## 2017-10-08 DIAGNOSIS — R7989 Other specified abnormal findings of blood chemistry: Secondary | ICD-10-CM | POA: Insufficient documentation

## 2017-10-08 DIAGNOSIS — E876 Hypokalemia: Secondary | ICD-10-CM

## 2017-10-08 DIAGNOSIS — I10 Essential (primary) hypertension: Secondary | ICD-10-CM | POA: Insufficient documentation

## 2017-10-08 DIAGNOSIS — E860 Dehydration: Secondary | ICD-10-CM | POA: Diagnosis not present

## 2017-10-08 DIAGNOSIS — F1210 Cannabis abuse, uncomplicated: Secondary | ICD-10-CM | POA: Diagnosis not present

## 2017-10-08 DIAGNOSIS — F1494 Cocaine use, unspecified with cocaine-induced mood disorder: Secondary | ICD-10-CM | POA: Diagnosis not present

## 2017-10-08 DIAGNOSIS — F17210 Nicotine dependence, cigarettes, uncomplicated: Secondary | ICD-10-CM | POA: Diagnosis not present

## 2017-10-08 DIAGNOSIS — J45909 Unspecified asthma, uncomplicated: Secondary | ICD-10-CM | POA: Insufficient documentation

## 2017-10-08 DIAGNOSIS — M25551 Pain in right hip: Secondary | ICD-10-CM | POA: Insufficient documentation

## 2017-10-08 DIAGNOSIS — Z91010 Allergy to peanuts: Secondary | ICD-10-CM | POA: Insufficient documentation

## 2017-10-08 LAB — CBG MONITORING, ED: Glucose-Capillary: 117 mg/dL — ABNORMAL HIGH (ref 70–99)

## 2017-10-08 MED ORDER — SODIUM CHLORIDE 0.9 % IV BOLUS
1000.0000 mL | Freq: Once | INTRAVENOUS | Status: AC
  Administered 2017-10-09: 1000 mL via INTRAVENOUS

## 2017-10-08 NOTE — ED Triage Notes (Signed)
EMS called [place] [facility] [place], for unwitnessed syncopal episode. Pt having hip pain. ETOH on board per EMS. Cspine immobilized for mechanism. Pt alert during triage, slurred speech. NSR 60-70s, Initial BP 77/44, recent 91/66. Pt got 600 cc fluid in route.

## 2017-10-08 NOTE — ED Provider Notes (Signed)
[facility] EMERGENCY DEPARTMENT Provider Note   CSN: [id number] Arrival date & time: 10/08/17  2249     History   Chief Complaint Chief Complaint  Patient presents with  . Loss of Consciousness    HPI Jonathan Martin is a 62 y.o. male.  Jonathan Martin is a 62 y.o. Male with a history of hypertension, asthma, depression and substance abuse, presents to the ED via EMS for evaluation for unwitnessed syncopal episode [place] bus stop.  Per EMS patient was initially hypotensive 77/40 but this improved somewhat with 600 of normal saline in route, C-spine was immobilized due to mechanism of fall and patient does endorse hitting his head.  He also is complaining of some right hip pain.  He denies any chest pain or shortness of breath currently and did not have any preceding the syncopal episode.  Denies headache, vision changes, nausea or vomiting and no dizziness, numbness or weakness.  Patient denies any abdominal pain, nausea or vomiting.  No fevers or recent illnesses.  Patient does admit to drinking alcohol prior to this episode but denies any other substance use.  Reports similar episodes in the past.     Past Medical History:  Diagnosis Date  . Asthma   . Cocaine abuse (HCC)   . Depression   . Hypertension     Patient Active Problem List   Diagnosis Date Noted  . Substance induced mood disorder (HCC) 06/01/2017  . Major depressive disorder, recurrent episode, severe (HCC) 05/11/2017  . Polysubstance abuse (HCC) 05/11/2017  . Suicidal ideations   . Alcoholic polyneuropathy (HCC)   . Chest pain 05/10/2017    History reviewed. No pertinent surgical history.      Home Medications    Prior to Admission medications   Medication Sig Start Date End Date Taking? Authorizing Provider  albuterol (PROVENTIL HFA;VENTOLIN HFA) 108 (90 Base) MCG/ACT inhaler Inhale 2 puffs into the lungs every 4 (four) hours as needed (sob). 10/04/17   Henderly, Britni A, PA-C    amLODipine (NORVASC) 10 MG tablet Take 1 tablet (10 mg total) by mouth daily for 7 days. For high blood pressure 10/04/17 10/11/17  Henderly, Britni A, PA-C  atorvastatin (LIPITOR) 20 MG tablet Take 1 tablet (20 mg total) by mouth daily at 6 PM. 05/14/17   Money, Gerlene Burdock, FNP  citalopram (CELEXA) 40 MG tablet Take 1 tablet (40 mg total) by mouth daily for 7 days. For mood control 10/04/17 10/11/17  Henderly, Britni A, PA-C  famotidine (PEPCID) 20 MG tablet Take 1 tablet (20 mg total) by mouth 2 (two) times daily. For acid reflux Patient not taking: Reported on 05/30/2017 05/14/17   Money, Gerlene Burdock, FNP  hydrOXYzine (ATARAX/VISTARIL) 25 MG tablet Take 1 tablet (25 mg total) by mouth every 6 (six) hours as needed for anxiety. Patient not taking: Reported on 05/30/2017 05/14/17   Money, Gerlene Burdock, FNP  metoprolol tartrate (LOPRESSOR) 50 MG tablet Take 1 tablet (50 mg total) by mouth 2 (two) times daily for 7 days. For high blood pessure 10/04/17 10/11/17  Henderly, Britni A, PA-C  tamsulosin (FLOMAX) 0.4 MG CAPS capsule Take 1 capsule (0.4 mg total) by mouth daily after supper for 7 days. 10/04/17 10/11/17  Henderly, Britni A, PA-C  traZODone (DESYREL) 50 MG tablet Take 1 tablet (50 mg total) by mouth at bedtime as needed for sleep. Patient not taking: Reported on 05/30/2017 05/14/17   Money, Gerlene Burdock, FNP    Family History No family history  on file.  Social History Social History   Tobacco Use  . Smoking status: Current Every Day Smoker    Packs/day: 1.00  . Smokeless tobacco: Current User  Substance Use Topics  . Alcohol use: Yes    Alcohol/week: 18.0 standard drinks    Types: 6 Glasses of wine, 12 Cans of beer per week    Comment: drinks daily  . Drug use: Yes    Frequency: 7.0 times per week    Types: Cocaine, Marijuana    Comment: uses daily on binges, whole paycheck     Allergies   Lisinopril and Peanut-containing drug products   Review of Systems Review of Systems Constitutional: Negative  for chills and fever.  HENT: Negative.   Eyes: Negative for visual disturbance.  Respiratory: Negative for cough and shortness of breath.   Cardiovascular: Negative for chest pain.  Gastrointestinal: Negative for abdominal pain, nausea and vomiting.  Genitourinary: Negative for dysuria and frequency.  Musculoskeletal: Negative for arthralgias, back pain, myalgias and neck pain.  Skin: Negative for color change and rash.  Neurological: Positive for syncope and light-headedness.    Physical Exam Updated Vital Signs BP 104/74   Pulse 65   Resp 13   SpO2 92%   Physical Exam Constitutional: He is oriented to person, place, and time. He appears well-developed and well-nourished. No distress.  Patient is sleeping but easily arousable, appears intoxicated, and responds to questions but is somewhat disoriented  HENT:  Head: Normocephalic and atraumatic.  Mouth/Throat: Oropharynx is clear and moist.  Eyes: Right eye exhibits no discharge. Left eye exhibits no discharge.  Neck: Neck supple.  Cardiovascular: Normal rate, regular rhythm, normal heart sounds and intact distal pulses. Exam reveals no gallop and no friction rub.  No murmur heard. Pulses:      Radial pulses are 2+ on the right side, and 2+ on the left side.       Dorsalis pedis pulses are 2+ on the right side, and 2+ on the left side.       Posterior tibial pulses are 2+ on the right side, and 2+ on the left side.  Pulmonary/Chest: Effort normal and breath sounds normal. No respiratory distress.  Respirations equal and unlabored, patient able to speak in full sentences, lungs clear to auscultation bilaterally  Abdominal: Soft. Bowel sounds are normal. He exhibits no distension and no mass. There is no tenderness. There is no guarding.  Abdomen soft, nondistended, nontender to palpation in all quadrants without guarding or peritoneal signs  Musculoskeletal: He exhibits no edema.  Neurological: He is alert and oriented to person,  place, and time. Coordination normal.  Speech is clear, able to follow commands CN III-XII intact Normal strength in upper and lower extremities bilaterally including dorsiflexion and plantar flexion, strong and equal grip strength Sensation normal to light and sharp touch Moves extremities without ataxia, coordination intact  Skin: Skin is warm and dry. Capillary refill takes less than 2 seconds. He is not diaphoretic.  Psychiatric: He has a normal mood and affect. His behavior is normal.  Nursing note and vitals reviewed.  ED Treatments / Results  Labs (all labs ordered are listed, but only abnormal results are displayed) Labs Reviewed  BASIC METABOLIC PANEL - Abnormal; Notable for the following components:      Result Value   Potassium 3.0 (*)    Glucose, Bld 119 (*)    Creatinine, Ser 2.04 (*)    Calcium 8.6 (*)    GFR calc non  Af Amer 33 (*)    GFR calc Af Amer 38 (*)    All other components within normal limits  CBC - Abnormal; Notable for the following components:   RBC 3.89 (*)    Hemoglobin 11.1 (*)    HCT 35.2 (*)    RDW 18.3 (*)    All other components within normal limits  URINALYSIS, ROUTINE W REFLEX MICROSCOPIC - Abnormal; Notable for the following components:   APPearance HAZY (*)    Hgb urine dipstick SMALL (*)    Bacteria, UA RARE (*)    All other components within normal limits  HEPATIC FUNCTION PANEL - Abnormal; Notable for the following components:   AST 49 (*)    Bilirubin, Direct 0.3 (*)    All other components within normal limits  ETHANOL - Abnormal; Notable for the following components:   Alcohol, Ethyl (B) 139 (*)    All other components within normal limits  RAPID URINE DRUG SCREEN, HOSP PERFORMED - Abnormal; Notable for the following components:   Cocaine POSITIVE (*)    Amphetamines POSITIVE (*)    Tetrahydrocannabinol POSITIVE (*)    All other components within normal limits  CBG MONITORING, ED - Abnormal; Notable for the following  components:   Glucose-Capillary 117 (*)    All other components within normal limits  I-STAT TROPONIN, ED    EKG EKG Interpretation  Date/Time:  Friday October 09 2017 00:17:43 EDT Ventricular Rate:  60 PR Interval:    QRS Duration: 110 QT Interval:  488 QTC Calculation: 488 R Axis:   57 Text Interpretation:  Sinus rhythm Borderline prolonged QT interval Confirmed by Zadie Rhine ([id number]) on 10/09/2017 12:30:19 AM   Radiology Dg Chest 2 View  Result Date: 10/09/2017 CLINICAL DATA:  Syncope, right hip pain, ETOH EXAM: CHEST - 2 VIEW COMPARISON:  10/04/2017 FINDINGS: Lungs are clear.  No pleural effusion or pneumothorax. The heart is normal in size. Visualized osseous structures are within normal limits. IMPRESSION: No evidence of acute cardiopulmonary disease. Electronically Signed   By: Charline Bills M.D.   On: 10/09/2017 00:17   Ct Head Wo Contrast  Result Date: 10/09/2017 CLINICAL DATA:  62 y/o  M; unwitnessed syncopal episode. EXAM: CT HEAD WITHOUT CONTRAST CT CERVICAL SPINE WITHOUT CONTRAST TECHNIQUE: Multidetector CT imaging of the head and cervical spine was performed following the standard protocol without intravenous contrast. Multiplanar CT image reconstructions of the cervical spine were also generated. COMPARISON:  None. FINDINGS: CT HEAD FINDINGS Brain: No evidence of acute infarction, hemorrhage, hydrocephalus, extra-axial collection or mass lesion/mass effect. Few nonspecific white matter hypodensities are compatible with mild chronic microvascular ischemic changes. Mild volume loss of the brain. Vascular: Calcific atherosclerosis of carotid siphons. No hyperdense vessel identified. Skull: Normal. Negative for fracture or focal lesion. Sinuses/Orbits: No acute finding. Other: None. CT CERVICAL SPINE FINDINGS Alignment: Normal. Skull base and vertebrae: No acute fracture. No primary bone lesion or focal pathologic process. Soft tissues and spinal canal: No prevertebral  fluid or swelling. No visible canal hematoma. Disc levels: Moderate cervical spondylosis with loss of intervertebral disc space height at the C5-C7 levels and mild multilevel facet arthrosis. Disc osteophyte complexes result in mild-to-moderate canal stenosis greatest at the C6-7 level. Additionally, uncovertebral and facet hypertrophy encroach on the neural foramen at the C5-6, C6-7, and C7-T1 levels bilaterally. Upper chest: Negative. Other: Negative. IMPRESSION: 1. No acute intracranial abnormality or calvarial fracture identified. 2. Mild for age chronic microvascular ischemic changes and volume loss of the  brain. 3. Moderate cervical spondylosis greatest at C6-7 level. Electronically Signed   By: Mitzi Hansen M.D.   On: 10/09/2017 00:19   Ct Cervical Spine Wo Contrast  Result Date: 10/09/2017 CLINICAL DATA:  62 y/o  M; unwitnessed syncopal episode. EXAM: CT HEAD WITHOUT CONTRAST CT CERVICAL SPINE WITHOUT CONTRAST TECHNIQUE: Multidetector CT imaging of the head and cervical spine was performed following the standard protocol without intravenous contrast. Multiplanar CT image reconstructions of the cervical spine were also generated. COMPARISON:  None. FINDINGS: CT HEAD FINDINGS Brain: No evidence of acute infarction, hemorrhage, hydrocephalus, extra-axial collection or mass lesion/mass effect. Few nonspecific white matter hypodensities are compatible with mild chronic microvascular ischemic changes. Mild volume loss of the brain. Vascular: Calcific atherosclerosis of carotid siphons. No hyperdense vessel identified. Skull: Normal. Negative for fracture or focal lesion. Sinuses/Orbits: No acute finding. Other: None. CT CERVICAL SPINE FINDINGS Alignment: Normal. Skull base and vertebrae: No acute fracture. No primary bone lesion or focal pathologic process. Soft tissues and spinal canal: No prevertebral fluid or swelling. No visible canal hematoma. Disc levels: Moderate cervical spondylosis with  loss of intervertebral disc space height at the C5-C7 levels and mild multilevel facet arthrosis. Disc osteophyte complexes result in mild-to-moderate canal stenosis greatest at the C6-7 level. Additionally, uncovertebral and facet hypertrophy encroach on the neural foramen at the C5-6, C6-7, and C7-T1 levels bilaterally. Upper chest: Negative. Other: Negative. IMPRESSION: 1. No acute intracranial abnormality or calvarial fracture identified. 2. Mild for age chronic microvascular ischemic changes and volume loss of the brain. 3. Moderate cervical spondylosis greatest at C6-7 level. Electronically Signed   By: Mitzi Hansen M.D.   On: 10/09/2017 00:19   Dg Hip Unilat W Or Wo Pelvis 2-3 Views Left  Result Date: 10/09/2017 CLINICAL DATA:  Syncope, hip pain, ETOH EXAM: DG HIP (WITH OR WITHOUT PELVIS) 2-3V LEFT COMPARISON:  None FINDINGS: No fracture or dislocation is seen. Mild degenerative changes of the left hip. Right hip joint space is preserved. Visualized bony pelvis appears intact. IMPRESSION: No fracture or dislocation is seen. Electronically Signed   By: Charline Bills M.D.   On: 10/09/2017 00:18    Procedures Procedures (including critical care time)  Medications Ordered in ED Medications  sodium chloride 0.9 % bolus 1,000 mL (0 mLs Intravenous Stopped 10/09/17 0116)  sodium chloride 0.9 % bolus 1,000 mL (0 mLs Intravenous Stopped 10/09/17 0146)  potassium chloride SA (K-DUR,KLOR-CON) CR tablet 40 mEq (40 mEq Oral Given 10/09/17 0413)     Initial Impression / Assessment and Plan / ED Course  I have reviewed the triage vital signs and the nursing notes.  Pertinent labs & imaging results that were available during my care of the patient were reviewed by me and considered in my medical decision making (see chart for details).  Patient presents for syncopal episode via EMS.  He did hit his head and has a c-collar in place, also complaining of some left hip pain.  Admits to alcohol  use and does appear intoxicated on exam.  Denies preceding chest pain or shortness of breath, no abdominal pain or nausea vomiting.  Was hypotensive on scene for EMS but blood pressures have been stable here.  We will get basic labs, urinalysis, UDS, ethanol level, troponin, EKG, CXR, CT head and neck and x-ray of the left hip.  IV fluid bolus.  Ridging reassuring no evidence of intracranial injury or C-spine fracture or malalignment, chest x-ray is clear and x-rays of the left hip show  no evidence of fracture dislocation.  EKG without concerning ischemic changes, normal sinus rhythm.  Troponin negative.  No leukocytosis, stable hemoglobin, patient has mild hypokalemia at 3.0, no other acute electrolyte derangements, creatinine of 2.04 which is increased from his baseline of around 1, I think this is likely due to dehydration, will give additional 1 L fluid bolus. Urinalysis without signs of infection.  Ethanol level is 139, UDS positive for cocaine and amphetamines and THC.  I suspect syncopal episode was related to dehydration in the setting of alcohol intoxication.  After 2 L of fluids patient is alert and oriented, responsive to questions appropriately, tolerating p.o., and ambulatory throughout the department without lightheadedness, and steady gait.  At this time I feel patient is stable for discharge home, encouraged to increase fluid intake and to follow-up with primary care for recheck of kidney function and potassium.  Return precautions have been discussed.  Patient expresses understanding and is in agreement with plan.  Patient discussed with Dr. Bebe Shaggy, who saw patient as well and agrees with plan.   Final Clinical Impressions(s) / ED Diagnoses   Final diagnoses:  Syncope and collapse  Alcoholic intoxication without complication (HCC)  Hypokalemia  Dehydration  Elevated serum creatinine    ED Discharge Orders         Ordered    potassium chloride (K-DUR) 10 MEQ tablet  Daily       10/09/17 0406           Dartha Lodge, PA-C 10/09/17 4098    Zadie Rhine, MD 10/10/17 [phone]

## 2017-10-08 NOTE — ED Notes (Signed)
Patient transported to CT 

## 2017-10-08 NOTE — ED Notes (Signed)
ED Provider at bedside. 

## 2017-10-09 ENCOUNTER — Emergency Department (HOSPITAL_COMMUNITY)
Admission: EM | Admit: 2017-10-09 | Discharge: 2017-10-10 | Disposition: A | Discharge status: Home / Self Care | Payer: Medicare Other | Source: Home / Self Care | Attending: Emergency Medicine | Admitting: Emergency Medicine

## 2017-10-09 ENCOUNTER — Encounter (HOSPITAL_COMMUNITY): Payer: Self-pay

## 2017-10-09 DIAGNOSIS — F17290 Nicotine dependence, other tobacco product, uncomplicated: Secondary | ICD-10-CM | POA: Insufficient documentation

## 2017-10-09 DIAGNOSIS — I10 Essential (primary) hypertension: Secondary | ICD-10-CM

## 2017-10-09 DIAGNOSIS — Z79899 Other long term (current) drug therapy: Secondary | ICD-10-CM | POA: Insufficient documentation

## 2017-10-09 DIAGNOSIS — Z91010 Allergy to peanuts: Secondary | ICD-10-CM | POA: Insufficient documentation

## 2017-10-09 DIAGNOSIS — Z046 Encounter for general psychiatric examination, requested by authority: Secondary | ICD-10-CM | POA: Insufficient documentation

## 2017-10-09 DIAGNOSIS — F329 Major depressive disorder, single episode, unspecified: Secondary | ICD-10-CM | POA: Insufficient documentation

## 2017-10-09 DIAGNOSIS — R45851 Suicidal ideations: Secondary | ICD-10-CM

## 2017-10-09 DIAGNOSIS — J45909 Unspecified asthma, uncomplicated: Secondary | ICD-10-CM

## 2017-10-09 DIAGNOSIS — F1910 Other psychoactive substance abuse, uncomplicated: Secondary | ICD-10-CM

## 2017-10-09 LAB — COMPREHENSIVE METABOLIC PANEL
ALK PHOS: 87 U/L (ref 38–126)
ALT: 27 U/L (ref 0–44)
ANION GAP: 7 (ref 5–15)
AST: 45 U/L — ABNORMAL HIGH (ref 15–41)
Albumin: 3.6 g/dL (ref 3.5–5.0)
BUN: 9 mg/dL (ref 8–23)
CHLORIDE: 106 mmol/L (ref 98–111)
CO2: 28 mmol/L (ref 22–32)
Calcium: 8.7 mg/dL — ABNORMAL LOW (ref 8.9–10.3)
Creatinine, Ser: 1.27 mg/dL — ABNORMAL HIGH (ref 0.61–1.24)
GFR calc non Af Amer: 58 mL/min — ABNORMAL LOW (ref >60)
GLUCOSE: 100 mg/dL — AB (ref 70–99)
Potassium: 3.8 mmol/L (ref 3.5–5.1)
SODIUM: 141 mmol/L (ref 135–145)
Total Bilirubin: 0.9 mg/dL (ref 0.3–1.2)
Total Protein: 7 g/dL (ref 6.5–8.1)

## 2017-10-09 LAB — RAPID URINE DRUG SCREEN, HOSP PERFORMED
AMPHETAMINES: POSITIVE — AB
Amphetamines: POSITIVE — AB
BARBITURATES: NOT DETECTED
BARBITURATES: NOT DETECTED
BENZODIAZEPINES: NOT DETECTED
BENZODIAZEPINES: NOT DETECTED
Cocaine: POSITIVE — AB
Cocaine: POSITIVE — AB
OPIATES: NOT DETECTED
Opiates: NOT DETECTED
TETRAHYDROCANNABINOL: POSITIVE — AB
Tetrahydrocannabinol: POSITIVE — AB

## 2017-10-09 LAB — URINALYSIS, ROUTINE W REFLEX MICROSCOPIC
BILIRUBIN URINE: NEGATIVE
Glucose, UA: NEGATIVE mg/dL
Ketones, ur: NEGATIVE mg/dL
LEUKOCYTES UA: NEGATIVE
NITRITE: NEGATIVE
PROTEIN: NEGATIVE mg/dL
Specific Gravity, Urine: 1.014 (ref 1.005–1.030)
pH: 5 (ref 5.0–8.0)

## 2017-10-09 LAB — HEPATIC FUNCTION PANEL
ALK PHOS: 82 U/L (ref 38–126)
ALT: 27 U/L (ref 0–44)
AST: 49 U/L — ABNORMAL HIGH (ref 15–41)
Albumin: 3.5 g/dL (ref 3.5–5.0)
BILIRUBIN INDIRECT: 0.9 mg/dL (ref 0.3–0.9)
BILIRUBIN TOTAL: 1.2 mg/dL (ref 0.3–1.2)
Bilirubin, Direct: 0.3 mg/dL — ABNORMAL HIGH (ref 0.0–0.2)
TOTAL PROTEIN: 7 g/dL (ref 6.5–8.1)

## 2017-10-09 LAB — BASIC METABOLIC PANEL
ANION GAP: 11 (ref 5–15)
BUN: 10 mg/dL (ref 8–23)
CALCIUM: 8.6 mg/dL — AB (ref 8.9–10.3)
CO2: 25 mmol/L (ref 22–32)
Chloride: 103 mmol/L (ref 98–111)
Creatinine, Ser: 2.04 mg/dL — ABNORMAL HIGH (ref 0.61–1.24)
GFR, EST AFRICAN AMERICAN: 38 mL/min — AB (ref >60)
GFR, EST NON AFRICAN AMERICAN: 33 mL/min — AB (ref >60)
Glucose, Bld: 119 mg/dL — ABNORMAL HIGH (ref 70–99)
Potassium: 3 mmol/L — ABNORMAL LOW (ref 3.5–5.1)
Sodium: 139 mmol/L (ref 135–145)

## 2017-10-09 LAB — CBC
HCT: 35.2 % — ABNORMAL LOW (ref 39.0–52.0)
HEMATOCRIT: 36.1 % — AB (ref 39.0–52.0)
HEMOGLOBIN: 11.1 g/dL — AB (ref 13.0–17.0)
HEMOGLOBIN: 11.4 g/dL — AB (ref 13.0–17.0)
MCH: 28.5 pg (ref 26.0–34.0)
MCH: 28.9 pg (ref 26.0–34.0)
MCHC: 31.5 g/dL (ref 30.0–36.0)
MCHC: 31.6 g/dL (ref 30.0–36.0)
MCV: 90.5 fL (ref 78.0–100.0)
MCV: 91.6 fL (ref 78.0–100.0)
Platelets: 301 10*3/uL (ref 150–400)
Platelets: 276 10*3/uL (ref 150–400)
RBC: 3.89 MIL/uL — AB (ref 4.22–5.81)
RBC: 3.94 MIL/uL — AB (ref 4.22–5.81)
RDW: 18.3 % — ABNORMAL HIGH (ref 11.5–15.5)
RDW: 18.1 % — ABNORMAL HIGH (ref 11.5–15.5)
WBC: 8.7 10*3/uL (ref 4.0–10.5)
WBC: 9.1 10*3/uL (ref 4.0–10.5)

## 2017-10-09 LAB — I-STAT TROPONIN, ED: Troponin i, poc: 0 ng/mL (ref 0.00–0.08)

## 2017-10-09 LAB — SALICYLATE LEVEL

## 2017-10-09 LAB — ACETAMINOPHEN LEVEL

## 2017-10-09 LAB — ETHANOL
ALCOHOL ETHYL (B): 139 mg/dL — AB (ref <10)
Alcohol, Ethyl (B): <10 mg/dL (ref <10)

## 2017-10-09 MED ORDER — LORAZEPAM 2 MG/ML IJ SOLN: 0.0000 mg | Freq: Four times a day (QID) | INTRAMUSCULAR | Status: DC

## 2017-10-09 MED ORDER — LORAZEPAM 1 MG PO TABS: 0.0000 mg | ORAL_TABLET | Freq: Two times a day (BID) | ORAL | Status: DC

## 2017-10-09 MED ORDER — LORAZEPAM 1 MG PO TABS: 0.0000 mg | ORAL_TABLET | Freq: Four times a day (QID) | ORAL | Status: DC

## 2017-10-09 MED ORDER — METOPROLOL TARTRATE 25 MG PO TABS
50.0000 mg | ORAL_TABLET | Freq: Two times a day (BID) | ORAL | Status: DC
  Administered 2017-10-09 – 2017-10-10 (×2): 50 mg via ORAL
  Filled 2017-10-09 (×2): qty 2

## 2017-10-09 MED ORDER — CITALOPRAM HYDROBROMIDE 10 MG PO TABS
10.0000 mg | ORAL_TABLET | Freq: Every day | ORAL | Status: DC
  Administered 2017-10-09 – 2017-10-10 (×2): 10 mg via ORAL
  Filled 2017-10-09 (×2): qty 1

## 2017-10-09 MED ORDER — POTASSIUM CHLORIDE CRYS ER 20 MEQ PO TBCR
40.0000 meq | EXTENDED_RELEASE_TABLET | Freq: Once | ORAL | Status: AC
  Administered 2017-10-09: 40 meq via ORAL
  Filled 2017-10-09: qty 2

## 2017-10-09 MED ORDER — THIAMINE HCL 100 MG/ML IJ SOLN: 100.0000 mg | Freq: Every day | INTRAMUSCULAR | Status: DC

## 2017-10-09 MED ORDER — VITAMIN B-1 100 MG PO TABS
100.0000 mg | ORAL_TABLET | Freq: Every day | ORAL | Status: DC
  Administered 2017-10-09 – 2017-10-10 (×2): 100 mg via ORAL
  Filled 2017-10-09 (×2): qty 1

## 2017-10-09 MED ORDER — LORAZEPAM 2 MG/ML IJ SOLN: 0.0000 mg | Freq: Two times a day (BID) | INTRAMUSCULAR | Status: DC

## 2017-10-09 MED ORDER — ALBUTEROL SULFATE HFA 108 (90 BASE) MCG/ACT IN AERS: 2.0000 | INHALATION_SPRAY | RESPIRATORY_TRACT | Status: DC | PRN

## 2017-10-09 MED ORDER — POTASSIUM CHLORIDE ER 10 MEQ PO TBCR: 10.0000 meq | EXTENDED_RELEASE_TABLET | Freq: Every day | ORAL | 0 refills | Status: AC

## 2017-10-09 MED ORDER — TAMSULOSIN HCL 0.4 MG PO CAPS
0.4000 mg | ORAL_CAPSULE | Freq: Every day | ORAL | Status: DC
  Administered 2017-10-09: 0.4 mg via ORAL
  Filled 2017-10-09: qty 1

## 2017-10-09 MED ORDER — SODIUM CHLORIDE 0.9 % IV BOLUS
1000.0000 mL | Freq: Once | INTRAVENOUS | Status: AC
  Administered 2017-10-09: 1000 mL via INTRAVENOUS

## 2017-10-09 MED ORDER — AMLODIPINE BESYLATE 5 MG PO TABS
10.0000 mg | ORAL_TABLET | Freq: Every day | ORAL | Status: DC
  Administered 2017-10-09 – 2017-10-10 (×2): 10 mg via ORAL
  Filled 2017-10-09 (×2): qty 2

## 2017-10-09 NOTE — ED Triage Notes (Signed)
Pt presents for evaluation of SI. States has significant alcohol and drug problem, states last ETOH was yesterday, last crack use was yesterday. Pt was here yesterday for syncopal episode and bus stop. Reports has been off his lexapro for 3-4 days because he lost his medication. Hx of similar SI feelings with auditory hallucinations.

## 2017-10-09 NOTE — ED Notes (Signed)
Gave pt sandwich and sprite 

## 2017-10-09 NOTE — ED Notes (Signed)
Pt had loose BM, given wash rags to clean self with, and new scrubs.

## 2017-10-09 NOTE — Progress Notes (Signed)
CSW consulted by RN as pt requesting to speak with CSW. CSW spoke with pt at bedside where pt asked that CSW direct pt to the proper services to get a payee. CSW informed pt that pt can go to the Department of Social Services to get further assistance with this once is medically stable and cleared. Pt agreeable at this time.    Claude Manges Eliezer Khawaja, MSW, LCSW-A Emergency Department Clinical Social Worker [phone]

## 2017-10-09 NOTE — ED Notes (Signed)
Pt eating breakfast and speaking with TTS.
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
## 2017-10-09 NOTE — ED Notes (Signed)
Pt. Ambulated throughout halls. Gait was good for what pt. States as being his baseline. Some SOB stated but sats are at 95%. No dizziness stated.

## 2017-10-09 NOTE — Discharge Instructions (Signed)
Your evaluation today is overall reassuring, labs and imaging look good aside from an increase in your kidney function likely due to dehydration and low potassium, you were given potassium here in the ED and I would like free to take a potassium tablet once daily for the next 5 days and have this rechecked with your primary care doctor.  Imaging of your head, neck, chest and hip show no evidence of acute injuries.  I think your syncopal episode was likely due to dehydration and alcohol intoxication please reduce your alcohol intake as it is impacting your safety.  Return to the emergency department for a couple episodes, chest pain, shortness of breath, lightheadedness or any other new or concerning symptoms.  Otherwise please follow-up with your primary care doctor.

## 2017-10-09 NOTE — ED Notes (Signed)
No sitters available to sit with patient at this time. Patient calm and cooperative, door left open for better visualization. Patient verbalizes understanding.

## 2017-10-09 NOTE — Care Management (Signed)
Writer referred patient to the [facility]

## 2017-10-09 NOTE — ED Provider Notes (Signed)
Patient seen/examined in the Emergency Department in conjunction with Midlevel Provider [facility] Patient presents for unwitnessed syncopal episode, initially hypotensive that is improving Exam : Patient is somnolent but arousable, appears confused.  Moves all extremities x4. Plan: Imaging and labs are pending at this time    Zadie Rhine, MD 10/09/17 0001

## 2017-10-09 NOTE — ED Provider Notes (Signed)
[facility] EMERGENCY DEPARTMENT Provider Note   CSN: [id number] Arrival date & time: 10/09/17  0702     History   Chief Complaint Chief Complaint  Patient presents with  . Suicidal    HPI Jonathan Martin is a 62 y.o. male.  Patient presents to the emergency department with complaint of suicidal ideation.  Patient has an extensive alcohol and drug problem.  He reports drinking hard lemonade as well as wine.  He also reports using cocaine.  He is seeking help with these.  He states that he feels very depressed and has plans to cut his jugular vein.  Patient was seen in the emergency department last night after having syncopal episode.  He was evaluated with imaging and lab work.  Patient had an elevated creatinine which was treated with IV fluids.  Patient was clinically improved and discharged.  Patient does not report any additional episodes of syncope or other acute medical complaints.     Past Medical History:  Diagnosis Date  . Asthma   . Cocaine abuse (HCC)   . Depression   . Hypertension     Patient Active Problem List   Diagnosis Date Noted  . Substance induced mood disorder (HCC) 06/01/2017  . Major depressive disorder, recurrent episode, severe (HCC) 05/11/2017  . Polysubstance abuse (HCC) 05/11/2017  . Suicidal ideations   . Alcoholic polyneuropathy (HCC)   . Chest pain 05/10/2017    History reviewed. No pertinent surgical history.      Home Medications    Prior to Admission medications   Medication Sig Start Date End Date Taking? Authorizing Provider  albuterol (PROVENTIL HFA;VENTOLIN HFA) 108 (90 Base) MCG/ACT inhaler Inhale 2 puffs into the lungs every 4 (four) hours as needed (sob). 10/04/17   Henderly, Britni A, PA-C  amLODipine (NORVASC) 10 MG tablet Take 1 tablet (10 mg total) by mouth daily for 7 days. For high blood pressure 10/04/17 10/11/17  Henderly, Britni A, PA-C  atorvastatin (LIPITOR) 20 MG tablet Take 1 tablet (20 mg total)  by mouth daily at 6 PM. 05/14/17   Money, Gerlene Burdock, FNP  citalopram (CELEXA) 40 MG tablet Take 1 tablet (40 mg total) by mouth daily for 7 days. For mood control 10/04/17 10/11/17  Henderly, Britni A, PA-C  famotidine (PEPCID) 20 MG tablet Take 1 tablet (20 mg total) by mouth 2 (two) times daily. For acid reflux Patient not taking: Reported on 05/30/2017 05/14/17   Money, Gerlene Burdock, FNP  hydrOXYzine (ATARAX/VISTARIL) 25 MG tablet Take 1 tablet (25 mg total) by mouth every 6 (six) hours as needed for anxiety. Patient not taking: Reported on 05/30/2017 05/14/17   Money, Gerlene Burdock, FNP  metoprolol tartrate (LOPRESSOR) 50 MG tablet Take 1 tablet (50 mg total) by mouth 2 (two) times daily for 7 days. For high blood pessure 10/04/17 10/11/17  Henderly, Britni A, PA-C  potassium chloride (K-DUR) 10 MEQ tablet Take 1 tablet (10 mEq total) by mouth daily. 10/09/17   Dartha Lodge, PA-C  tamsulosin (FLOMAX) 0.4 MG CAPS capsule Take 1 capsule (0.4 mg total) by mouth daily after supper for 7 days. 10/04/17 10/11/17  Henderly, Britni A, PA-C  traZODone (DESYREL) 50 MG tablet Take 1 tablet (50 mg total) by mouth at bedtime as needed for sleep. Patient not taking: Reported on 05/30/2017 05/14/17   Money, Gerlene Burdock, FNP    Family History No family history on file.  Social History Social History   Tobacco Use  . Smoking  status: Current Every Day Smoker    Packs/day: 1.00  . Smokeless tobacco: Current User  Substance Use Topics  . Alcohol use: Yes    Alcohol/week: 18.0 standard drinks    Types: 6 Glasses of wine, 12 Cans of beer per week    Comment: drinks daily  . Drug use: Yes    Frequency: 7.0 times per week    Types: Cocaine, Marijuana    Comment: uses daily on binges, whole paycheck     Allergies   Lisinopril and Peanut-containing drug products   Review of Systems Review of Systems  Constitutional: Negative for fever.  HENT: Negative for rhinorrhea and sore throat.   Eyes: Negative for redness.    Respiratory: Negative for cough.   Cardiovascular: Negative for chest pain.  Gastrointestinal: Negative for abdominal pain, diarrhea, nausea and vomiting.  Genitourinary: Negative for dysuria.  Musculoskeletal: Negative for myalgias.  Skin: Negative for rash.  Neurological: Negative for headaches.  Psychiatric/Behavioral: Positive for suicidal ideas.     Physical Exam Updated Vital Signs BP (!) 135/95   Pulse (!) 58   Temp 97.9 F (36.6 C) (Oral)   Resp 16   SpO2 99%   Physical Exam  Constitutional: He appears well-developed and well-nourished.  HENT:  Head: Normocephalic and atraumatic.  Eyes: Conjunctivae are normal. Right eye exhibits no discharge. Left eye exhibits no discharge.  Neck: Normal range of motion. Neck supple.  Cardiovascular: Normal rate, regular rhythm and normal heart sounds.  Pulmonary/Chest: Effort normal and breath sounds normal.  Abdominal: Soft. There is no tenderness.  Neurological: He is alert.  Skin: Skin is warm and dry.  Psychiatric: He has a normal mood and affect. He expresses suicidal ideation. He expresses no homicidal ideation. He expresses suicidal plans. He expresses no homicidal plans.  Nursing note and vitals reviewed.    ED Treatments / Results  Labs (all labs ordered are listed, but only abnormal results are displayed) Labs Reviewed  COMPREHENSIVE METABOLIC PANEL  ETHANOL  SALICYLATE LEVEL  ACETAMINOPHEN LEVEL  CBC  RAPID URINE DRUG SCREEN, HOSP PERFORMED    EKG None  Radiology Dg Chest 2 View  Result Date: 10/09/2017 CLINICAL DATA:  Syncope, right hip pain, ETOH EXAM: CHEST - 2 VIEW COMPARISON:  10/04/2017 FINDINGS: Lungs are clear.  No pleural effusion or pneumothorax. The heart is normal in size. Visualized osseous structures are within normal limits. IMPRESSION: No evidence of acute cardiopulmonary disease. Electronically Signed   By: Charline Bills M.D.   On: 10/09/2017 00:17   Ct Head Wo Contrast  Result  Date: 10/09/2017 CLINICAL DATA:  62 y/o  M; unwitnessed syncopal episode. EXAM: CT HEAD WITHOUT CONTRAST CT CERVICAL SPINE WITHOUT CONTRAST TECHNIQUE: Multidetector CT imaging of the head and cervical spine was performed following the standard protocol without intravenous contrast. Multiplanar CT image reconstructions of the cervical spine were also generated. COMPARISON:  None. FINDINGS: CT HEAD FINDINGS Brain: No evidence of acute infarction, hemorrhage, hydrocephalus, extra-axial collection or mass lesion/mass effect. Few nonspecific white matter hypodensities are compatible with mild chronic microvascular ischemic changes. Mild volume loss of the brain. Vascular: Calcific atherosclerosis of carotid siphons. No hyperdense vessel identified. Skull: Normal. Negative for fracture or focal lesion. Sinuses/Orbits: No acute finding. Other: None. CT CERVICAL SPINE FINDINGS Alignment: Normal. Skull base and vertebrae: No acute fracture. No primary bone lesion or focal pathologic process. Soft tissues and spinal canal: No prevertebral fluid or swelling. No visible canal hematoma. Disc levels: Moderate cervical spondylosis with loss of  intervertebral disc space height at the C5-C7 levels and mild multilevel facet arthrosis. Disc osteophyte complexes result in mild-to-moderate canal stenosis greatest at the C6-7 level. Additionally, uncovertebral and facet hypertrophy encroach on the neural foramen at the C5-6, C6-7, and C7-T1 levels bilaterally. Upper chest: Negative. Other: Negative. IMPRESSION: 1. No acute intracranial abnormality or calvarial fracture identified. 2. Mild for age chronic microvascular ischemic changes and volume loss of the brain. 3. Moderate cervical spondylosis greatest at C6-7 level. Electronically Signed   By: Mitzi Hansen M.D.   On: 10/09/2017 00:19   Ct Cervical Spine Wo Contrast  Result Date: 10/09/2017 CLINICAL DATA:  62 y/o  M; unwitnessed syncopal episode. EXAM: CT HEAD WITHOUT  CONTRAST CT CERVICAL SPINE WITHOUT CONTRAST TECHNIQUE: Multidetector CT imaging of the head and cervical spine was performed following the standard protocol without intravenous contrast. Multiplanar CT image reconstructions of the cervical spine were also generated. COMPARISON:  None. FINDINGS: CT HEAD FINDINGS Brain: No evidence of acute infarction, hemorrhage, hydrocephalus, extra-axial collection or mass lesion/mass effect. Few nonspecific white matter hypodensities are compatible with mild chronic microvascular ischemic changes. Mild volume loss of the brain. Vascular: Calcific atherosclerosis of carotid siphons. No hyperdense vessel identified. Skull: Normal. Negative for fracture or focal lesion. Sinuses/Orbits: No acute finding. Other: None. CT CERVICAL SPINE FINDINGS Alignment: Normal. Skull base and vertebrae: No acute fracture. No primary bone lesion or focal pathologic process. Soft tissues and spinal canal: No prevertebral fluid or swelling. No visible canal hematoma. Disc levels: Moderate cervical spondylosis with loss of intervertebral disc space height at the C5-C7 levels and mild multilevel facet arthrosis. Disc osteophyte complexes result in mild-to-moderate canal stenosis greatest at the C6-7 level. Additionally, uncovertebral and facet hypertrophy encroach on the neural foramen at the C5-6, C6-7, and C7-T1 levels bilaterally. Upper chest: Negative. Other: Negative. IMPRESSION: 1. No acute intracranial abnormality or calvarial fracture identified. 2. Mild for age chronic microvascular ischemic changes and volume loss of the brain. 3. Moderate cervical spondylosis greatest at C6-7 level. Electronically Signed   By: Mitzi Hansen M.D.   On: 10/09/2017 00:19   Dg Hip Unilat W Or Wo Pelvis 2-3 Views Left  Result Date: 10/09/2017 CLINICAL DATA:  Syncope, hip pain, ETOH EXAM: DG HIP (WITH OR WITHOUT PELVIS) 2-3V LEFT COMPARISON:  None FINDINGS: No fracture or dislocation is seen. Mild  degenerative changes of the left hip. Right hip joint space is preserved. Visualized bony pelvis appears intact. IMPRESSION: No fracture or dislocation is seen. Electronically Signed   By: Charline Bills M.D.   On: 10/09/2017 00:18    Procedures Procedures (including critical care time)  Medications Ordered in ED Medications - No data to display   Initial Impression / Assessment and Plan / ED Course  I have reviewed the triage vital signs and the nursing notes.  Pertinent labs & imaging results that were available during my care of the patient were reviewed by me and considered in my medical decision making (see chart for details).     Patient seen and examined. TTS ordered. Medical clearance pending.   Vital signs reviewed and are as follows: BP (!) 135/95   Pulse (!) 58   Temp 97.9 F (36.6 C) (Oral)   Resp 16   SpO2 99%   10:24 AM Labs reviewed. Pt is medically cleared. Creatinine improved from previous eval this AM.   Awaiting TTS reccs.   Final Clinical Impressions(s) / ED Diagnoses   Final diagnoses:  Suicidal thoughts  Polysubstance abuse (  [facility])    ED Discharge Orders    None       Renne Crigler, PA-C 10/09/17 1521    Wynetta Fines, MD 10/10/17 1149

## 2017-10-09 NOTE — ED Notes (Signed)
Lunch ordered via Service Response. 

## 2017-10-09 NOTE — ED Notes (Signed)
Pt changed out of clothes and into hospital scrubs. Witnessed by this NT. Belongings placed in locker #5 in [place] [facility].

## 2017-10-10 MED ORDER — CITALOPRAM HYDROBROMIDE 40 MG PO TABS: 40.0000 mg | ORAL_TABLET | Freq: Every day | ORAL | 0 refills | Status: DC

## 2017-10-10 MED ORDER — NICOTINE 21 MG/24HR TD PT24
21.0000 mg | MEDICATED_PATCH | Freq: Once | TRANSDERMAL | Status: DC
  Administered 2017-10-10: 21 mg via TRANSDERMAL
  Filled 2017-10-10: qty 1

## 2017-10-10 MED ORDER — METOPROLOL TARTRATE 50 MG PO TABS: 50.0000 mg | ORAL_TABLET | Freq: Two times a day (BID) | ORAL | 0 refills | Status: AC

## 2017-10-10 MED ORDER — AMLODIPINE BESYLATE 10 MG PO TABS: 10.0000 mg | ORAL_TABLET | Freq: Every day | ORAL | 0 refills | Status: DC

## 2017-10-10 MED ORDER — ATORVASTATIN CALCIUM 20 MG PO TABS: 20.0000 mg | ORAL_TABLET | Freq: Every day | ORAL | 0 refills | Status: AC

## 2017-10-10 MED ORDER — ALBUTEROL SULFATE HFA 108 (90 BASE) MCG/ACT IN AERS: 2.0000 | INHALATION_SPRAY | RESPIRATORY_TRACT | 0 refills | Status: AC | PRN

## 2017-10-10 NOTE — ED Notes (Signed)
Declined W/C at D/C and was escorted to lobby by RN. 

## 2017-10-10 NOTE — ED Notes (Signed)
Case Manager at bedside

## 2017-10-10 NOTE — Discharge Instructions (Signed)
Substance Abuse Treatment Programs ° °Intensive Outpatient Programs °[facility]     °[address]      °[place], [place]                   °[phone]      ° °[facility] °[address] °[place], [place] °[phone] ° °[facility]     °(Inpatient and outpatient)     °[address]           °[phone]   ° °[facility] °336-288-1484 (Suboxone and Methadone) ° °[address]      °[place], [place] [address]      °336-882-2125      ° °[address] °[place], [place] °852-3033 ° °[facility] (Outpatient/Inpatient, Chemical)    °(insurance only) [phone]      °       °[facility] (Groups & Residential) °[place], [place] °336-389-1413 ° °   °[facility]     °[address]     °[place], [place]      °[phone]      ° °[facility] (for caregivers and family) °[address] °[place], [place] °336-299-4655 ° ° ° ° ° °Residential Treatment Programs °[facility]      °[address], [place], [place] [address]  °([phone]      ° °[facility] °[address] [place], [place] [address] °[phone] ° °[facility]        °336-248-8914      ° °[facility] °1-800-659-3381 ° °[facility]             °[address]                                         °[place], [place]                                                °877-615-2722 or 336-784-9470                              ° °[facility] [place] °[address] °[place], [address] °1.877.941.8954 ° °[facility]    °[address]      °[place], [place]     °[phone]      ° °[facility] °[address] °[place], [place] °336-285-9073 ° °[facility]   °[address]     °[place], [place] [address]     °[phone]      °Admissions: 8am-3pm M-F ° °[facility] °[address] °[place],  [place] °336-227-7417 ° °[facility]: Residential Program (90 Days)   °[place], [place]      °336-725-8389 or 800-758-6077    ° °[facility] °[place], [place] °(Walk in Hours over the weekend or by referral) ° °[facility] °[address], [place],  [address] °(336) [phone] ° °Crisis Mobile: [facility]:  1-877-626-1772 (for crisis response 24 hours a day) °[facility] Hotline:      1-800-256-2452 °Outpatient Psychiatry and Counseling ° °[facility]: Mobile Crisis   Management 24 hours:  1-877-626-1772 ° °[facility] sliding scale fee and walk in schedule: M-F 8am-12pm/1pm-3pm °[address]  °[place], [place] [address] °[phone] ° °[facility] °[address] °[place], [place] [address] °336-703-9650 ° °[facility] ([facility])- new patient walk-in appointments available Monday - Friday 8am -3pm.          °[address] °[place], [place] [address] °336-676-6840 or crisis line- 336-676-6905 ° °[facility]/ Intensive Outpatient Therapy Program °[address] °[place], [place] [address] °336-832-9804 ° °[facility]                  °Crisis Services      °336.641.4993      °[address]     °[place], [place] [address]                ° °[facility]   °[facility] °[phone] °[address] °[place], [place] [address] ° ° °[facility]          °[address],  °[place], [place] [address]       °([phone] ° °[facility] °[address] °[place], [place] [address] °336-292-1510 ° °[facility]    °[address]    °[place], [place] [address]     °336-632-3505      ° °Parish McKinney, MD     °[address]     °[place] [address]     °336-282-1251     °  °[facility] °[address] °[place] [address] ° °[facility]     °[address]     °[place], [place]      °336-542-2076      ° °[facility] °Shamsher Ahluwalia, MD °[address] °[place], [place] [address] °336-420-9558 ° °[facility]     °[address]     °[place], [place] [address]     °336-274-1237      ° °[facility] °[address] °[place], [place] [address] °336-854-4450 °Resources for Temporary Residential Assistance/Crisis Centers ° °DAY CENTERS °[facility] °M-F 8am-3pm   °[address] [place], [place] [address]   336-332-0824 °Services include: laundry, barbering, support groups, case management, phone  & computer access, showers, AA/NA mtgs, mental health/substance abuse nurse, job skills class, disability information, VA assistance, spiritual classes, etc.  ° °HOMELESS SHELTERS ° °[facility]     °[facility]   °[address], [place]     °336.271.5959       °       °[facility] (women and children)       °[address] °[place], [place] [address] °336-275-0820 °[email or web address] for application and process °Application Required ° °[facility]   °[address]    °[place] [address]     °336.886.4922       °             °[facility] °[address] °, [place] [address] °336.273.5572 °336-235-0363(schedule application appt.) °Application Required ° °[facility] (women only)    °[address]     °[place], [place] [address]     °336-884-1039      °  Intake starts 6pm daily °Need valid ID, SSC, & Police report °[facility] [place] °[address] °[place], [place] °336-881-5420 °Application Required ° °[facility] (men only)     °[address]      °[place], [place]     °336.748.1962      ° °[facility] °(Pregnant women only) °[address] °[place], [place] °336-275-0206 ° °[facility]      °[address]      °[place], [place] [address]     °336-722-9951      °       °[facility] [place] [facility] °[address] °[place], [place] °336-723-1848 °90 day commitment/SA/Application process ° °[facility](men only)     °[address]     °[place], [place]     °336-748-1962       °Check-in at 7pm     °       °[facility] °[address] °[place], [place] [address] °336-248-6684 °Men/Women/Women and Children must be there by 7 pm ° °[facility] °[place], [place] °336-722-8721                ° °

## 2017-10-10 NOTE — Progress Notes (Signed)
CSW faxed referrals for placement to the following:   [facility] [place] [facility] [place] [facility]   CSW will follow regarding placement options.   Roselyn Bering, MSW, LCSW Clinical Social Work

## 2017-10-10 NOTE — ED Notes (Signed)
Pt awake eating breakfast tray. 

## 2017-10-10 NOTE — ED Notes (Signed)
Pt finished eating breakfast pt taking nap. Sitter at bedside.

## 2017-10-10 NOTE — ED Triage Notes (Signed)
TC to SW [phone]. Christiane Ha SW will be at [facility] at approx. 12noon. This Clinical research associate was directed by SW office to contact Christiane Ha to arrange transportation to [place] out PT .

## 2017-10-10 NOTE — ED Notes (Signed)
Pt awake in bedside talking with sitter.

## 2017-10-10 NOTE — ED Provider Notes (Signed)
Blood pressure (!) 165/89, pulse (!) 54, temperature 97.9 F (36.6 C), temperature source Oral, resp. rate 20, SpO2 97 %.  In short, Jonathan Martin is a 62 y.o. male with a chief complaint of Suicidal .  Refer to the original H&P for additional details.  9:43 AM Patient evaluated by behavioral health this morning.  Patient is denying any suicidal ideation or auditory hallucinations.  He is followed by the [place] and is asking for transport to [place].  Social work to evaluate.  Patient is psychiatrically cleared at this time.  Patient asking for refills of several medications which I will provide.  Alona Bene, MD    Maia Plan, MD 10/10/17 [phone]

## 2017-10-10 NOTE — Progress Notes (Signed)
Patient is seen by me via tele-psych and I have consulted with Dr. Lucianne Muss.  Patient states that he is feeling much better today since he has been restarted on his medications.  Patient denies any suicidal or homicidal ideations and denies any hallucinations.  Patient wants to know where he can go to and we informed him that we will provide him with some resources but since he has no permanent living arrangements he will probably go to a shelter or he can go back to [facility].  Patient request assistance with transportation to get him to [place] so that he can follow-up with the [facility].  Patient is also requesting refills on his medications before he discharges can help keep him stable.  Patient does not meet inpatient criteria and is psychiatrically cleared.  I have contacted Dr. Jacqulyn Bath and notified him of the recommendations.

## 2017-10-10 NOTE — ED Notes (Signed)
TTS assessment in progress. 

## 2017-10-10 NOTE — ED Notes (Signed)
TTS completed. 

## 2017-10-10 NOTE — BHH Counselor (Signed)
Patient re-assessed this morning   Report he's depressed and sad, report MH history PTSD, Depression and medical history COPD. Report was hoping life would get better once he turned 62. Thought life would be better, report his life is cloudy. Stated,"I have a real strong addition of cocaine, marijuana and alcohol." When asked do you feel suicidal patient stated it's 50/50, however, I am feeling better. Report he has been able to talk to people, have pleasant conversation and good company. Stated he needs someone he's age to talk to him who's in good spirits. Report the people he's around only want to drink, get high and spend his money. Denies homicidal ideations. Report last week he was having auditory / visual hallucinations. Denies current auditory / visual hallucination. Stated, "as long as I stay of drugs and alcohol I will be good." Report he needs to change the people he hangs around.    Reola Calkins, NP, joined tele-psych. Patient denies suicidal / homicidal ideations, denies auditory / visual hallucinations and denies feelings of paranoia. Feliz Beam offered patient bus passes so he's able to visit the [place] on Monday. Patient discussed he wanted to go to an assisted living facility. Patient reports she wants transportation to go to the [place] in [place].

## 2017-10-10 NOTE — ED Triage Notes (Signed)
Case Mangr. At Pt. Bed side to eval. Pt for needs.

## 2017-10-10 NOTE — Progress Notes (Addendum)
CSW at [facility] received calls regarding patient requesting transportation assistance to [place]. Patient is reportedly homeless and is unable to pay for a greyhound bus ticket or other transportation.  CSW informed that patient is following by the [place] and hopes to go to [place] to be reviewed on Monday, as the [place] does not do intakes on weekends. CSW aware that admission or acceptance is not guaranteed, as patient is awaiting review.  Of note, the [facility] bus does not go to [place], nor does it operate on weekends.  CSW will reach out to Social Work Chiropodist and update note.  UPDATE: Per SW AD, the request was declined and if patient is medically and psychiatrically cleared, it is most appropriate to discharge the patient. SW AD suggested the patient take advantage of [employer] resources. Per DAV website: "[employer]V operates a fleet of vehicles around the country to provide free transportation to [place] medical facilities for injured and ill veterans." [employer]   Enid Cutter, LCSW-A Clinical Social Worker [phone]

## 2017-10-10 NOTE — ED Notes (Signed)
Pt up and ambulatory, brushing teeth. Denies acute complaints. Breakfast tray ordered.

## 2017-10-10 NOTE — BHH Counselor (Signed)
TTS worker spoke with [place] SW [phone]) informing her of patient request for assistance to get to [place].

## 2017-10-10 NOTE — ED Triage Notes (Signed)
TC to Case Mgr. In ref. To transportation to [place].

## 2019-10-15 ENCOUNTER — Other Ambulatory Visit: Payer: Self-pay

## 2019-10-15 ENCOUNTER — Emergency Department (HOSPITAL_COMMUNITY): Payer: Medicare Other

## 2019-10-15 ENCOUNTER — Emergency Department (HOSPITAL_COMMUNITY)
Admission: EM | Admit: 2019-10-15 | Discharge: 2019-10-16 | Disposition: A | Discharge status: Other Acute Inpatient Hospital | Payer: Medicare Other | Attending: Emergency Medicine | Admitting: Emergency Medicine

## 2019-10-15 DIAGNOSIS — J45909 Unspecified asthma, uncomplicated: Secondary | ICD-10-CM | POA: Diagnosis not present

## 2019-10-15 DIAGNOSIS — Z760 Encounter for issue of repeat prescription: Secondary | ICD-10-CM | POA: Insufficient documentation

## 2019-10-15 DIAGNOSIS — R0602 Shortness of breath: Secondary | ICD-10-CM | POA: Diagnosis present

## 2019-10-15 DIAGNOSIS — F17200 Nicotine dependence, unspecified, uncomplicated: Secondary | ICD-10-CM | POA: Insufficient documentation

## 2019-10-15 DIAGNOSIS — J449 Chronic obstructive pulmonary disease, unspecified: Secondary | ICD-10-CM | POA: Diagnosis not present

## 2019-10-15 DIAGNOSIS — I10 Essential (primary) hypertension: Secondary | ICD-10-CM | POA: Insufficient documentation

## 2019-10-15 DIAGNOSIS — Z20822 Contact with and (suspected) exposure to covid-19: Secondary | ICD-10-CM | POA: Insufficient documentation

## 2019-10-15 DIAGNOSIS — R45851 Suicidal ideations: Secondary | ICD-10-CM | POA: Diagnosis not present

## 2019-10-15 DIAGNOSIS — Z91010 Allergy to peanuts: Secondary | ICD-10-CM | POA: Insufficient documentation

## 2019-10-15 DIAGNOSIS — Z79899 Other long term (current) drug therapy: Secondary | ICD-10-CM | POA: Diagnosis not present

## 2019-10-15 LAB — COMPREHENSIVE METABOLIC PANEL
ALT: 22 U/L (ref 0–44)
AST: 30 U/L (ref 15–41)
Albumin: 3.8 g/dL (ref 3.5–5.0)
Alkaline Phosphatase: 100 U/L (ref 38–126)
Anion gap: 14 (ref 5–15)
BUN: 7 mg/dL — ABNORMAL LOW (ref 8–23)
CO2: 21 mmol/L — ABNORMAL LOW (ref 22–32)
Calcium: 9 mg/dL (ref 8.9–10.3)
Chloride: 102 mmol/L (ref 98–111)
Creatinine, Ser: 1.21 mg/dL (ref 0.61–1.24)
GFR, Estimated: >60 mL/min (ref >60)
Glucose, Bld: 136 mg/dL — ABNORMAL HIGH (ref 70–99)
Potassium: 3.6 mmol/L (ref 3.5–5.1)
Sodium: 137 mmol/L (ref 135–145)
Total Bilirubin: 0.6 mg/dL (ref 0.3–1.2)
Total Protein: 7.7 g/dL (ref 6.5–8.1)

## 2019-10-15 LAB — RAPID URINE DRUG SCREEN, HOSP PERFORMED
Amphetamines: POSITIVE — AB
Barbiturates: NOT DETECTED
Benzodiazepines: NOT DETECTED
Cocaine: NOT DETECTED
Opiates: NOT DETECTED
Tetrahydrocannabinol: POSITIVE — AB

## 2019-10-15 LAB — CBC
HCT: 42.6 % (ref 39.0–52.0)
Hemoglobin: 13.9 g/dL (ref 13.0–17.0)
MCH: 30.2 pg (ref 26.0–34.0)
MCHC: 32.6 g/dL (ref 30.0–36.0)
MCV: 92.6 fL (ref 80.0–100.0)
Platelets: 272 10*3/uL (ref 150–400)
RBC: 4.6 MIL/uL (ref 4.22–5.81)
RDW: 16.1 % — ABNORMAL HIGH (ref 11.5–15.5)
WBC: 8.6 10*3/uL (ref 4.0–10.5)
nRBC: 0.5 % — ABNORMAL HIGH (ref 0.0–0.2)

## 2019-10-15 LAB — RESPIRATORY PANEL BY RT PCR (FLU A&B, COVID)
Influenza A by PCR: NEGATIVE
Influenza B by PCR: NEGATIVE
SARS Coronavirus 2 by RT PCR: NEGATIVE

## 2019-10-15 LAB — ETHANOL: Alcohol, Ethyl (B): 38 mg/dL — ABNORMAL HIGH (ref <10)

## 2019-10-15 LAB — ACETAMINOPHEN LEVEL: Acetaminophen (Tylenol), Serum: <10 ug/mL — ABNORMAL LOW (ref 10–30)

## 2019-10-15 LAB — SALICYLATE LEVEL: Salicylate Lvl: <7 mg/dL — ABNORMAL LOW (ref 7.0–30.0)

## 2019-10-15 NOTE — ED Triage Notes (Signed)
[place] from [place] for sudden onset shortness of breath. Hx of COPD and asthma and is out of his albuterol inhaler. Subjective fevers over the last few days.

## 2019-10-15 NOTE — ED Notes (Signed)
All clothes removed and pt in wine colored scrubs.  Security called to wand pt.

## 2019-10-15 NOTE — ED Notes (Signed)
While pt was waiting to be seen for his SOB he said that now he is suicidal.  Pt st's he is out of his medications.

## 2019-10-16 ENCOUNTER — Encounter (HOSPITAL_COMMUNITY): Payer: Self-pay

## 2019-10-16 ENCOUNTER — Ambulatory Visit (HOSPITAL_COMMUNITY)
Admission: EM | Admit: 2019-10-16 | Discharge: 2019-10-16 | Disposition: A | Discharge status: Home / Self Care | Payer: Medicare Other | Attending: Nurse Practitioner | Admitting: Nurse Practitioner

## 2019-10-16 ENCOUNTER — Other Ambulatory Visit: Payer: Self-pay

## 2019-10-16 DIAGNOSIS — Z5900 Homelessness unspecified: Secondary | ICD-10-CM | POA: Diagnosis not present

## 2019-10-16 DIAGNOSIS — F1020 Alcohol dependence, uncomplicated: Secondary | ICD-10-CM | POA: Diagnosis not present

## 2019-10-16 DIAGNOSIS — Z9151 Personal history of suicidal behavior: Secondary | ICD-10-CM | POA: Diagnosis not present

## 2019-10-16 DIAGNOSIS — Z8616 Personal history of COVID-19: Secondary | ICD-10-CM | POA: Insufficient documentation

## 2019-10-16 DIAGNOSIS — R45851 Suicidal ideations: Secondary | ICD-10-CM | POA: Insufficient documentation

## 2019-10-16 DIAGNOSIS — I10 Essential (primary) hypertension: Secondary | ICD-10-CM | POA: Diagnosis not present

## 2019-10-16 DIAGNOSIS — F17200 Nicotine dependence, unspecified, uncomplicated: Secondary | ICD-10-CM | POA: Insufficient documentation

## 2019-10-16 DIAGNOSIS — Z79899 Other long term (current) drug therapy: Secondary | ICD-10-CM | POA: Diagnosis not present

## 2019-10-16 DIAGNOSIS — F4310 Post-traumatic stress disorder, unspecified: Secondary | ICD-10-CM | POA: Insufficient documentation

## 2019-10-16 DIAGNOSIS — E785 Hyperlipidemia, unspecified: Secondary | ICD-10-CM | POA: Insufficient documentation

## 2019-10-16 DIAGNOSIS — F332 Major depressive disorder, recurrent severe without psychotic features: Secondary | ICD-10-CM | POA: Diagnosis not present

## 2019-10-16 DIAGNOSIS — J449 Chronic obstructive pulmonary disease, unspecified: Secondary | ICD-10-CM | POA: Diagnosis not present

## 2019-10-16 HISTORY — DX: Chronic obstructive pulmonary disease, unspecified: J44.9

## 2019-10-16 MED ORDER — CITALOPRAM HYDROBROMIDE 20 MG PO TABS
40.0000 mg | ORAL_TABLET | Freq: Every day | ORAL | Status: DC
  Administered 2019-10-16: 40 mg via ORAL
  Filled 2019-10-16: qty 2

## 2019-10-16 MED ORDER — ATORVASTATIN CALCIUM 40 MG PO TABS
40.0000 mg | ORAL_TABLET | Freq: Every day | ORAL | Status: DC
  Administered 2019-10-16: 40 mg via ORAL
  Filled 2019-10-16: qty 1

## 2019-10-16 MED ORDER — MAGNESIUM HYDROXIDE 400 MG/5ML PO SUSP: 30.0000 mL | Freq: Every day | ORAL | Status: DC | PRN

## 2019-10-16 MED ORDER — AMLODIPINE BESYLATE 5 MG PO TABS
5.0000 mg | ORAL_TABLET | Freq: Two times a day (BID) | ORAL | Status: DC
  Administered 2019-10-16: 5 mg via ORAL
  Filled 2019-10-16: qty 1

## 2019-10-16 MED ORDER — METOPROLOL SUCCINATE ER 50 MG PO TB24
50.0000 mg | ORAL_TABLET | Freq: Every day | ORAL | Status: DC
  Administered 2019-10-16: 50 mg via ORAL
  Filled 2019-10-16: qty 1

## 2019-10-16 MED ORDER — ALBUTEROL SULFATE HFA 108 (90 BASE) MCG/ACT IN AERS
2.0000 | INHALATION_SPRAY | RESPIRATORY_TRACT | Status: DC | PRN
  Administered 2019-10-16: 2 via RESPIRATORY_TRACT
  Filled 2019-10-16: qty 6.7

## 2019-10-16 MED ORDER — ALBUTEROL SULFATE HFA 108 (90 BASE) MCG/ACT IN AERS: 2.0000 | INHALATION_SPRAY | RESPIRATORY_TRACT | Status: DC | PRN

## 2019-10-16 MED ORDER — ACETAMINOPHEN 325 MG PO TABS: 650.0000 mg | ORAL_TABLET | Freq: Four times a day (QID) | ORAL | Status: DC | PRN

## 2019-10-16 MED ORDER — QUETIAPINE FUMARATE 50 MG PO TABS: 50.0000 mg | ORAL_TABLET | Freq: Every evening | ORAL | Status: DC | PRN

## 2019-10-16 MED ORDER — ALUM & MAG HYDROXIDE-SIMETH 200-200-20 MG/5ML PO SUSP: 30.0000 mL | ORAL | Status: DC | PRN

## 2019-10-16 MED ORDER — ONDANSETRON 4 MG PO TBDP: 4.0000 mg | ORAL_TABLET | Freq: Four times a day (QID) | ORAL | Status: DC | PRN

## 2019-10-16 MED ORDER — HYDROXYZINE HCL 25 MG PO TABS
25.0000 mg | ORAL_TABLET | Freq: Three times a day (TID) | ORAL | Status: DC | PRN
  Administered 2019-10-16: 25 mg via ORAL
  Filled 2019-10-16: qty 1

## 2019-10-16 MED ORDER — MELATONIN 3 MG PO TABS
3.0000 mg | ORAL_TABLET | Freq: Every day | ORAL | Status: DC
  Administered 2019-10-16: 3 mg via ORAL
  Filled 2019-10-16: qty 1

## 2019-10-16 MED ORDER — LORAZEPAM 1 MG PO TABS: 1.0000 mg | ORAL_TABLET | Freq: Four times a day (QID) | ORAL | Status: DC | PRN

## 2019-10-16 MED ORDER — THIAMINE HCL 100 MG PO TABS
100.0000 mg | ORAL_TABLET | Freq: Every day | ORAL | Status: DC
  Administered 2019-10-16: 100 mg via ORAL
  Filled 2019-10-16: qty 1

## 2019-10-16 MED ORDER — TAMSULOSIN HCL 0.4 MG PO CAPS
0.4000 mg | ORAL_CAPSULE | Freq: Every day | ORAL | Status: DC
  Administered 2019-10-16: 0.4 mg via ORAL
  Filled 2019-10-16 (×2): qty 1

## 2019-10-16 MED ORDER — TRAZODONE HCL 50 MG PO TABS: 50.0000 mg | ORAL_TABLET | Freq: Every evening | ORAL | Status: DC | PRN

## 2019-10-16 MED ORDER — FOLIC ACID 1 MG PO TABS
1.0000 mg | ORAL_TABLET | Freq: Every day | ORAL | Status: DC
  Administered 2019-10-16: 1 mg via ORAL
  Filled 2019-10-16: qty 1

## 2019-10-16 MED ORDER — LOPERAMIDE HCL 2 MG PO CAPS: 2.0000 mg | ORAL_CAPSULE | ORAL | Status: DC | PRN

## 2019-10-16 NOTE — ED Notes (Signed)
Pt. Put on blue paper scrubs

## 2019-10-16 NOTE — ED Notes (Signed)
Breakfast given- cereal and juice

## 2019-10-16 NOTE — ED Triage Notes (Signed)
Pt arrives via [facility] from [facility] with c/o SI with plan to walk into traffic or OD. Pt states still passive thoughts but verbally contracts for safety here. Reports past attempt via OD about 7 mo ago. Denies HI/AVH. A&O, calm & cooperative with initial assessment & shown to bed in obs area.

## 2019-10-16 NOTE — ED Notes (Signed)
Safe transport called 

## 2019-10-16 NOTE — ED Notes (Signed)
Patient given cereal for breakfast 

## 2019-10-16 NOTE — ED Notes (Signed)
Patient Belongings are stored in locker #07

## 2019-10-16 NOTE — ED Provider Notes (Addendum)
[facility] EMERGENCY DEPARTMENT Provider Note   CSN: [id number] Arrival date & time: 10/15/19  1817     History Chief Complaint  Patient presents with  . Shortness of Breath  . Suicidal    Jonathan Martin is a 64 y.o. male.  Patient with past medical history notable for COPD and polysubstance abuse presents to the emergency department with a chief complaint of suicidal thoughts and COPD exacerbation.  He states that he has been feeling very depressed and that he wants to step out in front of a truck.  He also reports that he has been having shortness of breath and is out of his inhaler.  He is also out of his other medications, and thinks that this is contributing to his depression.  He denies any fever, chills, or productive cough.  Denies any other associated symptoms.  He did drink some alcohol today, but denies any other drug use today.  The history is provided by the patient. No language interpreter was used.       Past Medical History:  Diagnosis Date  . Asthma   . Cocaine abuse (HCC)   . Depression   . Hypertension     Patient Active Problem List   Diagnosis Date Noted  . Substance induced mood disorder (HCC) 06/01/2017  . Major depressive disorder, recurrent episode, severe (HCC) 05/11/2017  . Polysubstance abuse (HCC) 05/11/2017  . Suicidal ideations   . Alcoholic polyneuropathy (HCC)   . Chest pain 05/10/2017    No past surgical history on file.     No family history on file.  Social History   Tobacco Use  . Smoking status: Current Every Day Smoker    Packs/day: 1.00  . Smokeless tobacco: Current User  Vaping Use  . Vaping Use: Never used  Substance Use Topics  . Alcohol use: Yes    Alcohol/week: 18.0 standard drinks    Types: 6 Glasses of wine, 12 Cans of beer per week    Comment: drinks daily  . Drug use: Yes    Frequency: 7.0 times per week    Types: Cocaine, Marijuana    Comment: uses daily on binges, whole paycheck     Home Medications Prior to Admission medications   Medication Sig Start Date End Date Taking? Authorizing Provider  albuterol (PROVENTIL HFA;VENTOLIN HFA) 108 (90 Base) MCG/ACT inhaler Inhale 2 puffs into the lungs every 4 (four) hours as needed for wheezing or shortness of breath. 10/10/17   Long, Arlyss Repress, MD  amLODipine (NORVASC) 10 MG tablet Take 1 tablet (10 mg total) by mouth daily. For high blood pressure 10/10/17 11/09/17  Long, Arlyss Repress, MD  atorvastatin (LIPITOR) 20 MG tablet Take 1 tablet (20 mg total) by mouth daily at 6 PM. 10/10/17 11/09/17  Long, Arlyss Repress, MD  citalopram (CELEXA) 40 MG tablet Take 1 tablet (40 mg total) by mouth daily. For mood control 10/10/17 11/09/17  Long, Arlyss Repress, MD  famotidine (PEPCID) 20 MG tablet Take 1 tablet (20 mg total) by mouth 2 (two) times daily. For acid reflux Patient not taking: Reported on 05/30/2017 05/14/17   Money, Gerlene Burdock, FNP  hydrOXYzine (ATARAX/VISTARIL) 25 MG tablet Take 1 tablet (25 mg total) by mouth every 6 (six) hours as needed for anxiety. Patient not taking: Reported on 05/30/2017 05/14/17   Money, Gerlene Burdock, FNP  metoprolol tartrate (LOPRESSOR) 50 MG tablet Take 1 tablet (50 mg total) by mouth 2 (two) times daily. For high blood pessure 10/10/17  11/09/17  Long, Arlyss Repress, MD  potassium chloride (K-DUR) 10 MEQ tablet Take 1 tablet (10 mEq total) by mouth daily. 10/09/17   Dartha Lodge, PA-C  traZODone (DESYREL) 50 MG tablet Take 1 tablet (50 mg total) by mouth at bedtime as needed for sleep. Patient not taking: Reported on 05/30/2017 05/14/17   Money, Gerlene Burdock, FNP    Allergies    Lisinopril and Peanut-containing drug products  Review of Systems   Review of Systems  All other systems reviewed and are negative.   Physical Exam Updated Vital Signs BP 128/90 (BP Location: Right Arm)   Pulse 70   Temp 98.3 F (36.8 C) (Oral)   Resp 18   Ht 6\' 1"  (1.854 m)   Wt 95.3 kg   SpO2 99%   BMI 27.71 kg/m   Physical Exam Vitals and  nursing note reviewed.  Constitutional:      Appearance: He is well-developed.  HENT:     Head: Normocephalic and atraumatic.  Eyes:     Conjunctiva/sclera: Conjunctivae normal.  Cardiovascular:     Rate and Rhythm: Normal rate and regular rhythm.     Heart sounds: No murmur heard.   Pulmonary:     Effort: Pulmonary effort is normal. No respiratory distress.     Breath sounds: Normal breath sounds.  Abdominal:     Palpations: Abdomen is soft.     Tenderness: There is no abdominal tenderness.  Musculoskeletal:     Cervical back: Neck supple.  Skin:    General: Skin is warm and dry.  Neurological:     Mental Status: He is alert and oriented to person, place, and time.  Psychiatric:        Mood and Affect: Mood normal.        Behavior: Behavior normal.     ED Results / Procedures / Treatments   Labs (all labs ordered are listed, but only abnormal results are displayed) Labs Reviewed  COMPREHENSIVE METABOLIC PANEL - Abnormal; Notable for the following components:      Result Value   CO2 21 (*)    Glucose, Bld 136 (*)    BUN 7 (*)    All other components within normal limits  ETHANOL - Abnormal; Notable for the following components:   Alcohol, Ethyl (B) 38 (*)    All other components within normal limits  SALICYLATE LEVEL - Abnormal; Notable for the following components:   Salicylate Lvl <7.0 (*)    All other components within normal limits  ACETAMINOPHEN LEVEL - Abnormal; Notable for the following components:   Acetaminophen (Tylenol), Serum <10 (*)    All other components within normal limits  CBC - Abnormal; Notable for the following components:   RDW 16.1 (*)    nRBC 0.5 (*)    All other components within normal limits  RAPID URINE DRUG SCREEN, HOSP PERFORMED - Abnormal; Notable for the following components:   Amphetamines POSITIVE (*)    Tetrahydrocannabinol POSITIVE (*)    All other components within normal limits  RESPIRATORY PANEL BY RT PCR (FLU A&B, COVID)     EKG None  Radiology DG Chest Portable 1 View  Result Date: 10/15/2019 CLINICAL DATA:  Shortness of breath and fever for several days. Asthma. COPD. EXAM: PORTABLE CHEST 1 VIEW COMPARISON:  10/12/2017 FINDINGS: The heart size and mediastinal contours are within normal limits. Pulmonary hyperinflation again noted, consistent with COPD. Both lungs are clear. The visualized skeletal structures are unremarkable. IMPRESSION: COPD. No active  disease. Electronically Signed   By: Danae Orleans M.D.   On: 10/15/2019 18:56    Procedures Procedures (including critical care time)  Medications Ordered in ED Medications  albuterol (VENTOLIN HFA) 108 (90 Base) MCG/ACT inhaler 2 puff (has no administration in time range)  melatonin tablet 3 mg (has no administration in time range)    ED Course  I have reviewed the triage vital signs and the nursing notes.  Pertinent labs & imaging results that were available during my care of the patient were reviewed by me and considered in my medical decision making (see chart for details).    MDM Rules/Calculators/A&P                         Patient here with shortness of breath.  He states he is out of his inhaler.  His lung sounds are slightly diminished, but I do not hear any wheezing.  He is not hypoxic.  Chest x-ray shows no active disease.  Vital signs are stable.  Laboratory work-up was unremarkable.  Slightly elevated ethanol level.  Covid test negative.  Patient is medically clear for TTS evaluation for his suicidal thoughts.  Case was discussed with Ala Dach at behavioral health, recommendation is for transfer to behavioral health urgent care.  Final Clinical Impression(s) / ED Diagnoses Final diagnoses:  Suicidal ideation  medication refill  Rx / DC Orders ED Discharge Orders    None       Roxy Horseman, PA-C 10/16/19 0306    Roxy Horseman, PA-C 10/16/19 0408    Maia Plan, MD 10/19/19 [phone]

## 2019-10-16 NOTE — Discharge Instructions (Addendum)

## 2019-10-16 NOTE — ED Notes (Signed)
Pt alert and oriented on the unit. Education, support, and encouragement provided. Discharge summary, medications and follow up appointments reviewed with pt. Suicide prevention resources provided. Pt's belongings in locker returned and belongings sheet signed. Pt denies SI/HI, A/VH, pain, or any concerns at this time. Pt ambulatory on and off unit. Pt discharged to lobby to be taken to [facility] via [employer].
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
## 2019-10-16 NOTE — BH Assessment (Signed)
2

## 2019-10-16 NOTE — ED Provider Notes (Signed)
FBC/OBS ASAP Discharge Summary  Date and Time: 10/16/2019 9:46 AM  Name: Jonathan Martin  MRN:  [id number]   Discharge Diagnoses:  Final diagnoses:  Severe recurrent major depression without psychotic features (HCC)  Alcohol use disorder, moderate, dependence (HCC)    Subjective: Patient states "I would really like to have some place to stay until I get paid on 10/20."  Patient reports he is typically homeless in [place] but came to [place] approximately 1 week ago for "peace and relaxation."  Patient reports he recently ran out of money and is unable to afford to reside in the hotel room moving forward.  Patient reports his long-term plan would be to reside in a senior living or assisted living facility.  Patient reports he believes he would be able to afford this as he receives [employer] and [place] benefits.  Patient reports alcohol use times many years but has been cutting down over the last few months.  Patient reports he now drinks approximately 3, 12 ounce beers per day.  Patient reports he only drinks as it helps him fall asleep at night.  Patient currently is not seeking substance use treatment but states he would be willing to participate and treatment program for the short-term only.  Patient reports he has a history of marijuana and cocaine use but has stopped using approximately 1 week ago.  Patient assessed by nurse practitioner.  Patient alert and oriented, answers appropriately.  Patient denies suicidal ideations.  Patient reports history of 2 prior suicide attempts.  Patient denies self-harm behaviors.  Patient denies homicidal ideations.  Patient denies auditory and visual hallucinations.  There is no evidence of delusional thought content and patient does not appear to be responding to internal stimuli.  Patient reports average sleep and appetite.  Patient reports he is diagnosed with depression and PTSD.  Patient reports he is established with outpatient  psychiatry provider at the [facility].  Patient reports he does not attend counseling and believes he would not benefit from counseling. Patient reports he has 4 sisters in [place] who are supportive but he prefers not to stay in their home related to his alcohol and substance use.  Patient denies any current outstanding legal concerns.  Patient offered support and encouragement.  Patient declines anyone to contact for collateral information.  Stay Summary: Patient initially presented to [facility] emergency department related to shortness of breath also reported suicidal ideations at that time.  Patient was then transported to the [facility] for observation and stabilization.  Patient appropriate and cooperative overnight at [facility].  Patient reports readiness to discharge.  Total Time spent with patient: 30 minutes  Past Psychiatric History: PTSD, MDD, alcohol use disorder Past Medical History:  Past Medical History:  Diagnosis Date  . Asthma   . Cocaine abuse (HCC)   . COPD (chronic obstructive pulmonary disease) (HCC)   . Depression   . Hypertension    History reviewed. No pertinent surgical history. Family History: History reviewed. No pertinent family history. Family Psychiatric History: None reported Social History:  Social History   Substance and Sexual Activity  Alcohol Use Yes  . Alcohol/week: 18.0 standard drinks  . Types: 6 Glasses of wine, 12 Cans of beer per week   Comment: drinks daily     Social History   Substance and Sexual Activity  Drug Use Yes  . Frequency: 7.0 times per week  . Types: Cocaine, Marijuana   Comment: uses daily on binges,  whole paycheck    Social History   Socioeconomic History  . Marital status: Divorced    Spouse name: Not on file  . Number of children: Not on file  . Years of education: Not on file  . Highest education level: Not on file  Occupational History  . Not on file  Tobacco Use  . Smoking  status: Current Every Day Smoker    Packs/day: 1.00  . Smokeless tobacco: Current User  Vaping Use  . Vaping Use: Never used  Substance and Sexual Activity  . Alcohol use: Yes    Alcohol/week: 18.0 standard drinks    Types: 6 Glasses of wine, 12 Cans of beer per week    Comment: drinks daily  . Drug use: Yes    Frequency: 7.0 times per week    Types: Cocaine, Marijuana    Comment: uses daily on binges, whole paycheck  . Sexual activity: Yes  Other Topics Concern  . Not on file  Social History Narrative  . Not on file   Social Determinants of Health   Financial Resource Strain:   . Difficulty of Paying Living Expenses: Not on file  Food Insecurity:   . Worried About Programme researcher, broadcasting/film/video in the Last Year: Not on file  . Ran Out of Food in the Last Year: Not on file  Transportation Needs:   . Lack of Transportation (Medical): Not on file  . Lack of Transportation (Non-Medical): Not on file  Physical Activity:   . Days of Exercise per Week: Not on file  . Minutes of Exercise per Session: Not on file  Stress:   . Feeling of Stress : Not on file  Social Connections:   . Frequency of Communication with Friends and Family: Not on file  . Frequency of Social Gatherings with Friends and Family: Not on file  . Attends Religious Services: Not on file  . Active Member of Clubs or Organizations: Not on file  . Attends Banker Meetings: Not on file  . Marital Status: Not on file   SDOH:  SDOH Screenings   Alcohol Screen:   . Last Alcohol Screening Score (AUDIT): Not on file  Depression (PHQ2-9):   . PHQ-2 Score: Not on file  Financial Resource Strain:   . Difficulty of Paying Living Expenses: Not on file  Food Insecurity:   . Worried About Programme researcher, broadcasting/film/video in the Last Year: Not on file  . Ran Out of Food in the Last Year: Not on file  Housing:   . Last Housing Risk Score: Not on file  Physical Activity:   . Days of Exercise per Week: Not on file  . Minutes  of Exercise per Session: Not on file  Social Connections:   . Frequency of Communication with Friends and Family: Not on file  . Frequency of Social Gatherings with Friends and Family: Not on file  . Attends Religious Services: Not on file  . Active Member of Clubs or Organizations: Not on file  . Attends Banker Meetings: Not on file  . Marital Status: Not on file  Stress:   . Feeling of Stress : Not on file  Tobacco Use: High Risk  . Smoking Tobacco Use: Current Every Day Smoker  . Smokeless Tobacco Use: Current User  Transportation Needs:   . Lack of Transportation (Medical): Not on file  . Lack of Transportation (Non-Medical): Not on file    Has this patient used any form of  tobacco in the last 30 days? (Cigarettes, Smokeless Tobacco, Cigars, and/or Pipes) A prescription for an FDA-approved tobacco cessation medication was offered at discharge and the patient refused  Current Medications:  Current Facility-Administered Medications  Medication Dose Route Frequency Provider Last Rate Last Admin  . acetaminophen (TYLENOL) tablet 650 mg  650 mg Oral Q6H PRN Nira ConnBerry, Jason A, NP      . albuterol (VENTOLIN HFA) 108 (90 Base) MCG/ACT inhaler 2 puff  2 puff Inhalation Q4H PRN Nira ConnBerry, Jason A, NP   2 puff at 10/16/19 0934  . alum & mag hydroxide-simeth (MAALOX/MYLANTA) 200-200-20 MG/5ML suspension 30 mL  30 mL Oral Q4H PRN Nira ConnBerry, Jason A, NP      . amLODipine (NORVASC) tablet 5 mg  5 mg Oral BID Nira ConnBerry, Jason A, NP   5 mg at 10/16/19 0930  . atorvastatin (LIPITOR) tablet 40 mg  40 mg Oral Daily Nira ConnBerry, Jason A, NP   40 mg at 10/16/19 0930  . citalopram (CELEXA) tablet 40 mg  40 mg Oral Daily Nira ConnBerry, Jason A, NP   40 mg at 10/16/19 [id number]  . folic acid (FOLVITE) tablet 1 mg  1 mg Oral Daily Nira ConnBerry, Jason A, NP   1 mg at 10/16/19 [id number]  . hydrOXYzine (ATARAX/VISTARIL) tablet 25 mg  25 mg Oral TID PRN Jackelyn PolingBerry, Jason A, NP   25 mg at 10/16/19 0929  . loperamide (IMODIUM) capsule 2-4 mg  2-4 mg  Oral PRN Nira ConnBerry, Jason A, NP      . LORazepam (ATIVAN) tablet 1 mg  1 mg Oral Q6H PRN Nira ConnBerry, Jason A, NP      . magnesium hydroxide (MILK OF MAGNESIA) suspension 30 mL  30 mL Oral Daily PRN Nira ConnBerry, Jason A, NP      . metoprolol succinate (TOPROL-XL) 24 hr tablet 50 mg  50 mg Oral Daily Nira ConnBerry, Jason A, NP   50 mg at 10/16/19 0930  . ondansetron (ZOFRAN-ODT) disintegrating tablet 4 mg  4 mg Oral Q6H PRN Nira ConnBerry, Jason A, NP      . QUEtiapine (SEROQUEL) tablet 50 mg  50 mg Oral QHS PRN Nira ConnBerry, Jason A, NP      . tamsulosin (FLOMAX) capsule 0.4 mg  0.4 mg Oral Daily Nira ConnBerry, Jason A, NP   0.4 mg at 10/16/19 0930  . thiamine tablet 100 mg  100 mg Oral Daily Nira ConnBerry, Jason A, NP   100 mg at 10/16/19 0930  . traZODone (DESYREL) tablet 50 mg  50 mg Oral QHS PRN Jackelyn PolingBerry, Jason A, NP       Current Outpatient Medications  Medication Sig Dispense Refill  . amLODipine (NORVASC) 5 MG tablet Take 5 mg by mouth 2 (two) times daily.    Marland Kitchen. atorvastatin (LIPITOR) 40 MG tablet Take 40 mg by mouth daily.    . citalopram (CELEXA) 40 MG tablet Take 1 tablet by mouth daily.    . metoprolol succinate (TOPROL-XL) 50 MG 24 hr tablet Take by mouth.    . QUEtiapine (SEROQUEL) 100 MG tablet Take 50 mg by mouth at bedtime as needed.    . tamsulosin (FLOMAX) 0.4 MG CAPS capsule Take 1 capsule by mouth daily.    Marland Kitchen. acetaminophen (TYLENOL) 500 MG tablet Take 1,000 mg by mouth every 6 (six) hours as needed.    Marland Kitchen. albuterol (PROVENTIL HFA;VENTOLIN HFA) 108 (90 Base) MCG/ACT inhaler Inhale 2 puffs into the lungs every 4 (four) hours as needed for wheezing or shortness of breath. 1 Inhaler 0  .  atorvastatin (LIPITOR) 20 MG tablet Take 1 tablet (20 mg total) by mouth daily at 6 PM. 30 tablet 0  . famotidine (PEPCID) 20 MG tablet Take 1 tablet (20 mg total) by mouth 2 (two) times daily. For acid reflux (Patient not taking: Reported on 05/30/2017) 60 tablet 0  . folic acid (FOLVITE) 1 MG tablet Take 1 mg by mouth daily.    . hydrOXYzine  (ATARAX/VISTARIL) 25 MG tablet Take 1 tablet (25 mg total) by mouth every 6 (six) hours as needed for anxiety. (Patient not taking: Reported on 05/30/2017) 30 tablet 0  . metoprolol tartrate (LOPRESSOR) 50 MG tablet Take 1 tablet (50 mg total) by mouth 2 (two) times daily. For high blood pessure 60 tablet 0  . metoprolol tartrate (LOPRESSOR) 50 MG tablet Take 1 tablet by mouth 2 (two) times daily.    . potassium chloride (K-DUR) 10 MEQ tablet Take 1 tablet (10 mEq total) by mouth daily. 5 tablet 0  . SM MUCUS RELIEF 600 MG 12 hr tablet Take 600 mg by mouth every 12 (twelve) hours as needed.    . thiamine 100 MG tablet Take 1 tablet by mouth daily.      PTA Medications: (Not in a hospital admission)   Musculoskeletal  Strength & Muscle Tone: within normal limits Gait & Station: normal Patient leans: N/A  Psychiatric Specialty Exam  Presentation  General Appearance: Casual;Neat  Eye Contact:Good  Speech:Clear and Coherent;Normal Rate  Speech Volume:Normal  Handedness:Right   Mood and Affect  Mood:Anxious;Depressed;Hopeless;Worthless  Affect:Congruent;Depressed   Art gallery manager Processes:Coherent;Linear  Descriptions of Associations:Intact  Orientation:Full (Time, Place and Person)  Thought Content:WDL  Hallucinations:Hallucinations: None  Ideas of Reference:None  Suicidal Thoughts:Suicidal Thoughts: Yes, Active SI Active Intent and/or Plan: With Intent;With Plan;With Means to Carry Out  Homicidal Thoughts:Homicidal Thoughts: Yes, Passive HI Passive Intent and/or Plan: Without Intent;Without Plan   Sensorium  Memory:Immediate Good;Recent Good;Remote Good  Judgment:Intact  Insight:Fair   Executive Functions  Concentration:Good  Attention Span:Good  Recall:Good  Fund of Knowledge:Good  Language:Good   Psychomotor Activity  Psychomotor Activity:Psychomotor Activity: Normal   Assets  Assets:Communication Skills;Desire for  Improvement   Sleep  Sleep:Sleep: Fair   Physical Exam  Physical Exam Vitals and nursing note reviewed.  Constitutional:      Appearance: He is well-developed.  HENT:     Head: Normocephalic.  Cardiovascular:     Rate and Rhythm: Normal rate.  Pulmonary:     Effort: Pulmonary effort is normal.  Neurological:     Mental Status: He is alert and oriented to person, place, and time.  Psychiatric:        Attention and Perception: Attention and perception normal.        Mood and Affect: Mood and affect normal.        Speech: Speech normal.        Behavior: Behavior normal. Behavior is cooperative.        Thought Content: Thought content normal.        Cognition and Memory: Cognition and memory normal.        Judgment: Judgment normal.    Review of Systems  Constitutional: Negative.   HENT: Negative.   Eyes: Negative.   Respiratory: Negative.   Cardiovascular: Negative.   Gastrointestinal: Negative.   Genitourinary: Negative.   Musculoskeletal: Negative.   Skin: Negative.   Neurological: Negative.   Endo/Heme/Allergies: Negative.   Psychiatric/Behavioral: Positive for substance abuse.   Blood pressure (!) 127/91, pulse 65, temperature  97.7 F (36.5 C), temperature source Tympanic, resp. rate 20, height  (1.854 m), weight 95.3 kg, SpO2 97 %. Body mass index is 27.71 kg/m.  Demographic Factors:  Male and Age 75 or older  Loss Factors: NA  Historical Factors: NA  Risk Reduction Factors:   Sense of responsibility to family, Positive social support, Positive therapeutic relationship and Positive coping skills or problem solving skills  Continued Clinical Symptoms:  Alcohol/Substance Abuse/Dependencies  Cognitive Features That Contribute To Risk:  None    Suicide Risk:  Minimal: No identifiable suicidal ideation.  Patients presenting with no risk factors but with morbid ruminations; may be classified as minimal risk based on the severity of the depressive  symptoms  Plan Of Care/Follow-up recommendations:  Other:  Patient reviewed with Dr. Nelly Rout.  Patient plans to discharge to [facility] rescue [facility].  Patient reports plan to follow-up with established outpatient psychiatric provider at the [facility] once he receives his check he plans to return to the [place], [place] area.  Disposition: Discharge  Patrcia Dolly, FNP 10/16/2019, 9:46 AM

## 2019-10-16 NOTE — ED Provider Notes (Signed)
Behavioral Health Admission H&P [facility] & OBS)  Date: 10/16/19 Patient Name: Jonathan Martin MRN: [id number] Chief Complaint:  Chief Complaint  Patient presents with  . Suicidal      Diagnoses:  Final diagnoses:  Severe recurrent major depression without psychotic features (HCC)  Alcohol use disorder, moderate, dependence (HCC)    HPI: Jonathan Martin is a 64 y.o. male with a history of COPD, recent COVID hospitalization, depression, PTSD, and alcohol abuse who presented voluntarily to [facility] ED due to shortness of breath. He was medically cleared and while there reported suicidal ideations. He was evaluated by TTS and transferred to [facility] for continuous assessment.   Patient reports that he his depression has worsened over the past 4-5 days. He reports suicidal ideations with thoughts of walking in front of a truck. He reports one previous suicide attempt approximately 7 months ago by overdosing on HTN medication. He reports that he was hospitalized for 12 days due to COVID. He states that he has not fully recovered from COVID and continues to experience fatigue, body aches, and loss of smell. He states that he has no social support and that he has been homeless since his and his wife divorced approximately 4 years ago. He reports that he has some feeling of wanting to harm others due to frustration. He denies homicidal intent or plans. He denies auditory and visual hallucinations. No indication that he is responding to internal stimuli. No evidence of delusional thought content. He reports a history cocaine, marijuana, and alcohol use. States that he no longer uses cocaine. States that drinks approximately 24 ounces of alcohol daily. Occasionally smoke marijuana. BAL 38 in the ED. UDS positive for THC and amphetamines.   Pt reports he receives medication management through Dr. Glenetta Hew at the [facility] clinic in [place]. He has no other mental health providers. He reports he has been psychiatrically  hospitalized several times in the past. Pt's medical record indicates he was hospitalized at [facility] in May 2019.   On evaluation patient is alert and oriented x 4, pleasant, and cooperative. Speech is clear and coherent. Mood is depressed and affect is congruent with mood. Thought process is coherent and thought content is logical. Denies auditory and visual hallucinations. No indication that patient is responding to internal stimuli. No evidence of delusional thought content. Endorses suicidal ideations with thoughts of walking in front of a truck. Reports some thoughts of wanting to hurt others, but denies homicidal intent or plan.  PHQ 2-9:     ED from 10/16/2019 in [facility] ED from 10/09/2017 in [facility] EMERGENCY DEPARTMENT  C-SSRS RISK CATEGORY Moderate Risk High Risk       Total Time spent with patient: 30 minutes  Musculoskeletal  Strength & Muscle Tone: within normal limits Gait & Station: normal Patient leans: N/A  Psychiatric Specialty Exam  Presentation General Appearance: Casual;Neat  Eye Contact:Good  Speech:Clear and Coherent;Normal Rate  Speech Volume:Normal  Handedness:Right   Mood and Affect  Mood:Anxious;Depressed;Hopeless;Worthless  Affect:Congruent;Depressed   Art gallery manager Processes:Coherent;Linear  Descriptions of Associations:Intact  Orientation:Full (Time, Place and Person)  Thought Content:WDL  Hallucinations:Hallucinations: None  Ideas of Reference:None  Suicidal Thoughts:Suicidal Thoughts: Yes, Active SI Active Intent and/or Plan: With Intent;With Plan;With Means to Carry Out  Homicidal Thoughts:Homicidal Thoughts: Yes, Passive HI Passive Intent and/or Plan: Without Intent;Without Plan   Sensorium  Memory:Immediate Good;Recent Good;Remote Good  Judgment:Intact  Insight:Fair   Executive Functions  Concentration:Good  Attention Span:Good  Recall:Good  Fund  of Knowledge:Good  Language:Good   Psychomotor Activity  Psychomotor Activity:Psychomotor Activity: Normal   Assets  Assets:Communication Skills;Desire for Improvement   Sleep  Sleep:Sleep: Fair   Physical Exam Constitutional:      General: He is not in acute distress.    Appearance: He is not ill-appearing, toxic-appearing or diaphoretic.  HENT:     Head: Normocephalic.     Right Ear: External ear normal.     Left Ear: External ear normal.  Eyes:     Pupils: Pupils are equal, round, and reactive to light.  Cardiovascular:     Rate and Rhythm: Normal rate.  Pulmonary:     Effort: Pulmonary effort is normal. No respiratory distress.  Musculoskeletal:        General: Normal range of motion.  Skin:    General: Skin is warm and dry.  Neurological:     Mental Status: He is alert and oriented to person, place, and time.  Psychiatric:        Mood and Affect: Mood is anxious and depressed.        Speech: Speech normal.        Behavior: Behavior is cooperative.        Thought Content: Thought content is not paranoid or delusional. Thought content includes homicidal and suicidal ideation. Thought content includes suicidal plan.    Review of Systems  Constitutional: Negative for chills, diaphoresis, fever, malaise/fatigue and weight loss.  HENT: Negative for congestion.   Respiratory: Negative for cough and shortness of breath.   Cardiovascular: Negative for chest pain and palpitations.  Gastrointestinal: Negative for diarrhea, nausea and vomiting.  Neurological: Negative for dizziness and seizures.  Psychiatric/Behavioral: Positive for depression, substance abuse and suicidal ideas. Negative for hallucinations and memory loss. The patient is nervous/anxious and has insomnia.   All other systems reviewed and are negative.   Blood pressure 132/85, pulse 65, temperature (!) 97.5 F (36.4 C), temperature source Tympanic, resp. rate 20, height 6\' 1"  (1.854 m), weight 95.3 kg,  SpO2 100 %. Body mass index is 27.71 kg/m.  Past Psychiatric History: MDD, PTSD, Alcohol Use Disorder. He reports he has been psychiatrically hospitalized several times in the past. Pt's medical record indicates he was hospitalized at [facility] in May 2019.   Is the patient at risk to self? Yes  Has the patient been a risk to self in the past 6 months? No .    Has the patient been a risk to self within the distant past? Yes   Is the patient a risk to others? Yes   Has the patient been a risk to others in the past 6 months? No   Has the patient been a risk to others within the distant past? No   Past Medical History:  Past Medical History:  Diagnosis Date  . Asthma   . Cocaine abuse (HCC)   . COPD (chronic obstructive pulmonary disease) (HCC)   . Depression   . Hypertension    History reviewed. No pertinent surgical history.  Family History: History reviewed. No pertinent family history.  Social History:  Social History   Socioeconomic History  . Marital status: Divorced    Spouse name: Not on file  . Number of children: Not on file  . Years of education: Not on file  . Highest education level: Not on file  Occupational History  . Not on file  Tobacco Use  . Smoking status: Current Every Day Smoker  Packs/day: 1.00  . Smokeless tobacco: Current User  Vaping Use  . Vaping Use: Never used  Substance and Sexual Activity  . Alcohol use: Yes    Alcohol/week: 18.0 standard drinks    Types: 6 Glasses of wine, 12 Cans of beer per week    Comment: drinks daily  . Drug use: Yes    Frequency: 7.0 times per week    Types: Cocaine, Marijuana    Comment: uses daily on binges, whole paycheck  . Sexual activity: Yes  Other Topics Concern  . Not on file  Social History Narrative  . Not on file   Social Determinants of Health   Financial Resource Strain:   . Difficulty of Paying Living Expenses: Not on file  Food Insecurity:   . Worried About Programme researcher, broadcasting/film/video in the  Last Year: Not on file  . Ran Out of Food in the Last Year: Not on file  Transportation Needs:   . Lack of Transportation (Medical): Not on file  . Lack of Transportation (Non-Medical): Not on file  Physical Activity:   . Days of Exercise per Week: Not on file  . Minutes of Exercise per Session: Not on file  Stress:   . Feeling of Stress : Not on file  Social Connections:   . Frequency of Communication with Friends and Family: Not on file  . Frequency of Social Gatherings with Friends and Family: Not on file  . Attends Religious Services: Not on file  . Active Member of Clubs or Organizations: Not on file  . Attends Banker Meetings: Not on file  . Marital Status: Not on file  Intimate Partner Violence:   . Fear of Current or Ex-Partner: Not on file  . Emotionally Abused: Not on file  . Physically Abused: Not on file  . Sexually Abused: Not on file    SDOH:  SDOH Screenings   Alcohol Screen:   . Last Alcohol Screening Score (AUDIT): Not on file  Depression (PHQ2-9):   . PHQ-2 Score: Not on file  Financial Resource Strain:   . Difficulty of Paying Living Expenses: Not on file  Food Insecurity:   . Worried About Programme researcher, broadcasting/film/video in the Last Year: Not on file  . Ran Out of Food in the Last Year: Not on file  Housing:   . Last Housing Risk Score: Not on file  Physical Activity:   . Days of Exercise per Week: Not on file  . Minutes of Exercise per Session: Not on file  Social Connections:   . Frequency of Communication with Friends and Family: Not on file  . Frequency of Social Gatherings with Friends and Family: Not on file  . Attends Religious Services: Not on file  . Active Member of Clubs or Organizations: Not on file  . Attends Banker Meetings: Not on file  . Marital Status: Not on file  Stress:   . Feeling of Stress : Not on file  Tobacco Use: High Risk  . Smoking Tobacco Use: Current Every Day Smoker  . Smokeless Tobacco Use: Current  User  Transportation Needs:   . Lack of Transportation (Medical): Not on file  . Lack of Transportation (Non-Medical): Not on file    Last Labs:  Admission on 10/15/2019, Discharged on 10/16/2019  Component Date Value Ref Range Status  . SARS Coronavirus 2 by RT PCR 10/15/2019 NEGATIVE  NEGATIVE Final   Comment: (NOTE) SARS-CoV-2 target nucleic acids are NOT DETECTED.  The SARS-CoV-2 RNA is generally detectable in upper respiratoy specimens during the acute phase of infection. The lowest concentration of SARS-CoV-2 viral copies this assay can detect is 131 copies/mL. A negative result does not preclude SARS-Cov-2 infection and should not be used as the sole basis for treatment or other patient management decisions. A negative result may occur with  improper specimen collection/handling, submission of specimen other than nasopharyngeal swab, presence of viral mutation(s) within the areas targeted by this assay, and inadequate number of viral copies (<131 copies/mL). A negative result must be combined with clinical observations, patient history, and epidemiological information. The expected result is Negative.  Fact Sheet for Patients:  https://www.moore.com/  Fact Sheet for Healthcare Providers:  https://www.young.biz/  This test is no                          t yet approved or cleared by the [place] FDA and  has been authorized for detection and/or diagnosis of SARS-CoV-2 by FDA under an Emergency Use Authorization (EUA). This EUA will remain  in effect (meaning this test can be used) for the duration of the COVID-19 declaration under Section 564(b)(1) of the Act, 21 U.S.C. section 360bbb-3(b)(1), unless the authorization is terminated or revoked sooner.    . Influenza A by PCR 10/15/2019 NEGATIVE  NEGATIVE Final  . Influenza B by PCR 10/15/2019 NEGATIVE  NEGATIVE Final   Comment: (NOTE) The Xpert Xpress SARS-CoV-2/FLU/RSV assay  is intended as an aid in  the diagnosis of influenza from Nasopharyngeal swab specimens and  should not be used as a sole basis for treatment. Nasal washings and  aspirates are unacceptable for Xpert Xpress SARS-CoV-2/FLU/RSV  testing.  Fact Sheet for Patients: https://www.moore.com/  Fact Sheet for Healthcare Providers: https://www.young.biz/  This test is not yet approved or cleared by the [place] FDA and  has been authorized for detection and/or diagnosis of SARS-CoV-2 by  FDA under an Emergency Use Authorization (EUA). This EUA will remain  in effect (meaning this test can be used) for the duration of the  Covid-19 declaration under Section 564(b)(1) of the Act, 21  U.S.C. section 360bbb-3(b)(1), unless the authorization is  terminated or revoked. Performed at [facility], [address] [place], [place] [address]   . Sodium 10/15/2019 137  135 - 145 mmol/L Final  . Potassium 10/15/2019 3.6  3.5 - 5.1 mmol/L Final  . Chloride 10/15/2019 102  98 - 111 mmol/L Final  . CO2 10/15/2019 21* 22 - 32 mmol/L Final  . Glucose, Bld 10/15/2019 136* 70 - 99 mg/dL Final   Glucose reference range applies only to samples taken after fasting for at least 8 hours.  . BUN 10/15/2019 7* 8 - 23 mg/dL Final  . Creatinine, Ser 10/15/2019 1.21  0.61 - 1.24 mg/dL Final  . Calcium 74/12/8784 9.0  8.9 - 10.3 mg/dL Final  . Total Protein 10/15/2019 7.7  6.5 - 8.1 g/dL Final  . Albumin 76/72/0947 3.8  3.5 - 5.0 g/dL Final  . AST 09/62/8366 30  15 - 41 U/L Final  . ALT 10/15/2019 22  0 - 44 U/L Final  . Alkaline Phosphatase 10/15/2019 100  38 - 126 U/L Final  . Total Bilirubin 10/15/2019 0.6  0.3 - 1.2 mg/dL Final  . GFR, Estimated 10/15/2019 >60  >60 mL/min Final  . Anion gap 10/15/2019 14  5 - 15 Final   Performed at [facility], [address] [place], [place] [address]  . Alcohol, Ethyl (B) 10/15/2019 38* <10 mg/dL Final   Comment:  (NOTE) Lowest detectable limit for serum alcohol is 10 mg/dL.  For medical purposes only. Performed at [facility], [address] [place], [place] [address]   . Salicylate Lvl 10/15/2019 <7.0* 7.0 - 30.0 mg/dL Final   Performed at [facility], [address] [place], [place] [address]  . Acetaminophen (Tylenol), Serum 10/15/2019 <10* 10 - 30 ug/mL Final   Comment: (NOTE) Therapeutic concentrations vary significantly. A range of 10-30 ug/mL  may be an effective concentration for many patients. However, some  are best treated at concentrations outside of this range. Acetaminophen concentrations >150 ug/mL at 4 hours after ingestion  and >50 ug/mL at 12 hours after ingestion are often associated with  toxic reactions.  Performed at [facility], [address] [place], [place] [address]   . WBC 10/15/2019 8.6  4.0 - 10.5 K/uL Final  . RBC 10/15/2019 4.60  4.22 - 5.81 MIL/uL Final  . Hemoglobin 10/15/2019 13.9  13.0 - 17.0 g/dL Final  . HCT 95/28/413210/09/2019 42.6  39 - 52 % Final  . MCV 10/15/2019 92.6  80.0 - 100.0 fL Final  . MCH 10/15/2019 30.2  26.0 - 34.0 pg Final  . MCHC 10/15/2019 32.6  30.0 - 36.0 g/dL Final  . RDW 44/01/027210/09/2019 16.1* 11.5 - 15.5 % Final  . Platelets 10/15/2019 272  150 - 400 K/uL Final  . nRBC 10/15/2019 0.5* 0.0 - 0.2 % Final   Performed at [facility], [address] [place], [place] [address]  . Opiates 10/15/2019 NONE DETECTED  NONE DETECTED Final  . Cocaine 10/15/2019 NONE DETECTED  NONE DETECTED Final  . Benzodiazepines 10/15/2019 NONE DETECTED  NONE DETECTED Final  . Amphetamines 10/15/2019 POSITIVE* NONE DETECTED Final  . Tetrahydrocannabinol 10/15/2019 POSITIVE* NONE DETECTED Final  . Barbiturates 10/15/2019 NONE DETECTED  NONE DETECTED Final   Comment: (NOTE) DRUG SCREEN FOR MEDICAL PURPOSES ONLY.  IF CONFIRMATION IS NEEDED FOR ANY PURPOSE, NOTIFY LAB WITHIN 5 DAYS.  LOWEST DETECTABLE LIMITS FOR URINE DRUG SCREEN Drug  Class                     Cutoff (ng/mL) Amphetamine and metabolites    1000 Barbiturate and metabolites    200 Benzodiazepine                 200 Tricyclics and metabolites     300 Opiates and metabolites        300 Cocaine and metabolites        300 THC                            50 Performed at [facility], [address] [place], [place] [address]     Allergies: Lisinopril and Peanut-containing drug products  PTA Medications: (Not in a hospital admission)   Medical Decision Making  Patient was medically cleared in the ED. Reviewed labs Glucose 136, CMP otherwise unremarkable RDW slightly elevated at 16.1 and nRBC 0.5, CBC otherwise unremarkable BAL 38 UDS positive for amphetamines and THC  Initiate CIWA protocol with Ativan 1 mg every 6 hours prn for CIWA >10  Resume home medications Amlodipine 5 mg BID for HTN Metoprolol XL 50 mg daily for HTN Atorvastatin 40 mg daily for hyperlipidemia tamsulosin 0.4 mg daily  Quetiapine  50 mg QHS prn for sleep Citalopram 40 mg daily for depression Thiamine 100 mg daily for nutritional supplementation Folic acid 1 mg daily for nutritional supplementation  Albuterol INH 2 puffs every 4 hours prn wheezing/SOB     Recommendations  Based on my evaluation the patient does not appear to have an emergency medical condition.   Patient will be placed in the continuous assessment area at [facility] for treatment and stabilization.      Jackelyn Poling, NP 10/16/19  6:22 AM

## 2019-10-16 NOTE — Progress Notes (Signed)
CSW spoke with pt and assessed that he was open to engaging in treatment at the [facility]. CSW contacted the [facility] and assessed that they do have male beds available today. CSW will arrange for transportation.    Wells Guiles, MSW, LCSW, LCAS Clinical Social Worker II Disposition CSW [phone]

## 2019-10-16 NOTE — ED Notes (Signed)
Report given to nurse at [facility].  

## 2021-12-14 IMAGING — CR DG CHEST 1V PORT
1 series · 1 of 1 positions shown · non-contrast
Comparison: 10/12/2017

CLINICAL DATA: Shortness of breath and fever for several days.
Asthma. COPD.

EXAM:
PORTABLE CHEST 1 VIEW

[AP]
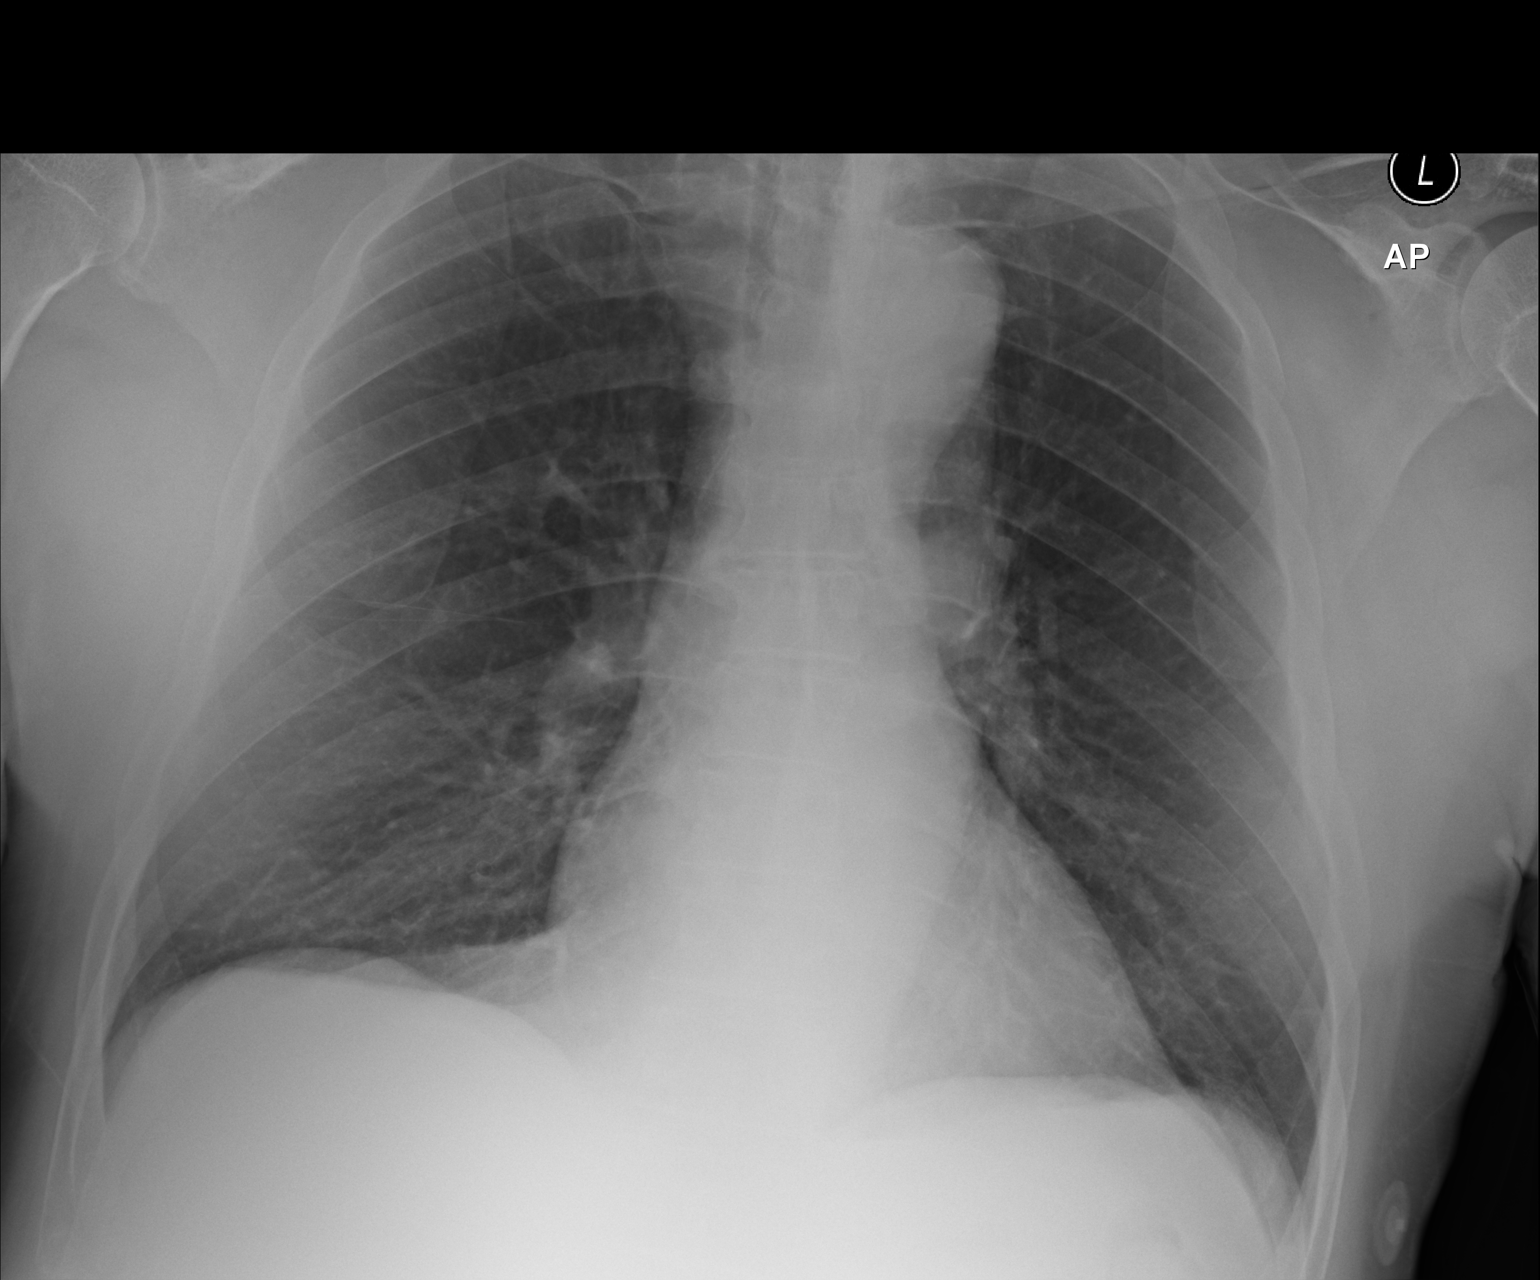

[1 of 1 positions shown; findings below may reference images not displayed]

FINDINGS: The heart size and mediastinal contours are within normal limits.
Pulmonary hyperinflation again noted, consistent with COPD. Both
lungs are clear. The visualized skeletal structures are
unremarkable.
IMPRESSION: COPD. No active disease.
